# Patient Record
Sex: Male | Born: 1950 | Race: Black or African American | Hispanic: No | Smoking: Never smoker
Health system: Southern US, Community
[De-identification: ages and names within clinical notes are randomized; demographics above are authoritative.]

## PROBLEM LIST (undated history)

## (undated) DIAGNOSIS — G47419 Narcolepsy without cataplexy: Secondary | ICD-10-CM

## (undated) DIAGNOSIS — F419 Anxiety disorder, unspecified: Secondary | ICD-10-CM

## (undated) DIAGNOSIS — C61 Malignant neoplasm of prostate: Secondary | ICD-10-CM

## (undated) DIAGNOSIS — G4730 Sleep apnea, unspecified: Secondary | ICD-10-CM

## (undated) DIAGNOSIS — K219 Gastro-esophageal reflux disease without esophagitis: Secondary | ICD-10-CM

## (undated) DIAGNOSIS — E785 Hyperlipidemia, unspecified: Secondary | ICD-10-CM

## (undated) DIAGNOSIS — F32A Depression, unspecified: Secondary | ICD-10-CM

## (undated) DIAGNOSIS — M1990 Unspecified osteoarthritis, unspecified site: Secondary | ICD-10-CM

## (undated) DIAGNOSIS — F4310 Post-traumatic stress disorder, unspecified: Secondary | ICD-10-CM

## (undated) DIAGNOSIS — I10 Essential (primary) hypertension: Secondary | ICD-10-CM

## (undated) DIAGNOSIS — H409 Unspecified glaucoma: Secondary | ICD-10-CM

## (undated) HISTORY — PX: FOOT SURGERY: SHX648

## (undated) HISTORY — PX: SHOULDER SURGERY: SHX246

## (undated) HISTORY — PX: THROAT SURGERY: SHX803

---

## 2019-10-13 ENCOUNTER — Encounter: Payer: Self-pay | Admitting: Emergency Medicine

## 2019-10-13 ENCOUNTER — Other Ambulatory Visit: Payer: Self-pay

## 2019-10-13 ENCOUNTER — Emergency Department: Payer: Medicare Other

## 2019-10-13 DIAGNOSIS — Z7901 Long term (current) use of anticoagulants: Secondary | ICD-10-CM | POA: Insufficient documentation

## 2019-10-13 DIAGNOSIS — I82432 Acute embolism and thrombosis of left popliteal vein: Secondary | ICD-10-CM | POA: Insufficient documentation

## 2019-10-13 DIAGNOSIS — I82462 Acute embolism and thrombosis of left calf muscular vein: Secondary | ICD-10-CM | POA: Diagnosis not present

## 2019-10-13 DIAGNOSIS — I10 Essential (primary) hypertension: Secondary | ICD-10-CM | POA: Diagnosis not present

## 2019-10-13 DIAGNOSIS — M79605 Pain in left leg: Secondary | ICD-10-CM | POA: Diagnosis present

## 2019-10-13 LAB — CBC WITH DIFFERENTIAL/PLATELET
Abs Immature Granulocytes: 0.01 10*3/uL (ref 0.00–0.07)
Basophils Absolute: 0 10*3/uL (ref 0.0–0.1)
Basophils Relative: 0 %
Eosinophils Absolute: 0.2 10*3/uL (ref 0.0–0.5)
Eosinophils Relative: 4 %
HCT: 40.9 % (ref 39.0–52.0)
Hemoglobin: 13.7 g/dL (ref 13.0–17.0)
Immature Granulocytes: 0 %
Lymphocytes Relative: 25 %
Lymphs Abs: 1.5 10*3/uL (ref 0.7–4.0)
MCH: 30.5 pg (ref 26.0–34.0)
MCHC: 33.5 g/dL (ref 30.0–36.0)
MCV: 91.1 fL (ref 80.0–100.0)
Monocytes Absolute: 0.7 10*3/uL (ref 0.1–1.0)
Monocytes Relative: 11 %
Neutro Abs: 3.7 10*3/uL (ref 1.7–7.7)
Neutrophils Relative %: 60 %
Platelets: 151 10*3/uL (ref 150–400)
RBC: 4.49 MIL/uL (ref 4.22–5.81)
RDW: 12.4 % (ref 11.5–15.5)
WBC: 6 10*3/uL (ref 4.0–10.5)
nRBC: 0 % (ref 0.0–0.2)

## 2019-10-13 LAB — COMPREHENSIVE METABOLIC PANEL
ALT: 16 U/L (ref 0–44)
AST: 19 U/L (ref 15–41)
Albumin: 3.9 g/dL (ref 3.5–5.0)
Alkaline Phosphatase: 61 U/L (ref 38–126)
Anion gap: 9 (ref 5–15)
BUN: 12 mg/dL (ref 8–23)
CO2: 27 mmol/L (ref 22–32)
Calcium: 8.8 mg/dL — ABNORMAL LOW (ref 8.9–10.3)
Chloride: 105 mmol/L (ref 98–111)
Creatinine, Ser: 0.99 mg/dL (ref 0.61–1.24)
GFR calc Af Amer: >60 mL/min (ref >60)
GFR calc non Af Amer: >60 mL/min (ref >60)
Glucose, Bld: 134 mg/dL — ABNORMAL HIGH (ref 70–99)
Potassium: 3.5 mmol/L (ref 3.5–5.1)
Sodium: 141 mmol/L (ref 135–145)
Total Bilirubin: 0.6 mg/dL (ref 0.3–1.2)
Total Protein: 8 g/dL (ref 6.5–8.1)

## 2019-10-13 LAB — BRAIN NATRIURETIC PEPTIDE: B Natriuretic Peptide: 81 pg/mL (ref 0.0–100.0)

## 2019-10-13 NOTE — ED Triage Notes (Signed)
PT arrives with complaints of left lower leg swelling and pain for the last week.

## 2019-10-13 NOTE — ED Notes (Signed)
Patient transported to Ultrasound 

## 2019-10-14 ENCOUNTER — Emergency Department
Admission: EM | Admit: 2019-10-14 | Discharge: 2019-10-14 | Disposition: A | Discharge status: Home / Self Care | Payer: Medicare Other | Attending: Emergency Medicine | Admitting: Emergency Medicine

## 2019-10-14 ENCOUNTER — Emergency Department: Payer: Medicare Other

## 2019-10-14 DIAGNOSIS — I82462 Acute embolism and thrombosis of left calf muscular vein: Secondary | ICD-10-CM | POA: Diagnosis not present

## 2019-10-14 DIAGNOSIS — I82432 Acute embolism and thrombosis of left popliteal vein: Secondary | ICD-10-CM

## 2019-10-14 HISTORY — DX: Unspecified osteoarthritis, unspecified site: M19.90

## 2019-10-14 HISTORY — DX: Essential (primary) hypertension: I10

## 2019-10-14 LAB — PROTIME-INR
INR: 1 (ref 0.8–1.2)
Prothrombin Time: 13.3 seconds (ref 11.4–15.2)

## 2019-10-14 LAB — APTT: aPTT: 40 seconds — ABNORMAL HIGH (ref 24–36)

## 2019-10-14 MED ORDER — GADOBUTROL 1 MMOL/ML IV SOLN
9.0000 mL | Freq: Once | INTRAVENOUS | Status: AC | PRN
  Administered 2019-10-14: 9 mL via INTRAVENOUS

## 2019-10-14 MED ORDER — OXYCODONE-ACETAMINOPHEN 5-325 MG PO TABS: 1.0000 | ORAL_TABLET | ORAL | 0 refills | Status: AC | PRN

## 2019-10-14 MED ORDER — ENOXAPARIN SODIUM 100 MG/ML ~~LOC~~ SOLN
100.0000 mg | Freq: Once | SUBCUTANEOUS | Status: AC
  Administered 2019-10-14: 100 mg via SUBCUTANEOUS
  Filled 2019-10-14: qty 1

## 2019-10-14 MED ORDER — APIXABAN 5 MG PO TABS: ORAL_TABLET | ORAL | 0 refills | Status: DC

## 2019-10-14 NOTE — ED Notes (Signed)
Pt on phone with MRI

## 2019-10-14 NOTE — ED Provider Notes (Signed)
[facility] Emergency Department Provider Note    First MD Initiated Contact with Patient 10/14/19 0147     (approximate)  I have reviewed the triage vital signs and the nursing notes.   HISTORY  Chief Complaint Leg Swelling   HPI Alex Sanders is a 68 y.o. male with below list of previous medical conditions presents emergency department secondary to left lower extremity pain and swelling times x1 week.  Patient states that secondary to bilateral osteoarthritis of the knees he was wearing a knee brace a week ago including sleeping in the brace.  Patient states that he felt as though he may have "bad circulation as a result of the knee brace".  Patient denies any pain at present however does admit to considerable pain with weightbearing.  Patient denies any chest pain or shortness of breath.  No previous history of DVT or PE.       Past Medical History:  Diagnosis Date   Arthritis    Hypertension     There are no active problems to display for this patient.   Past Surgical History:  Procedure Laterality Date   FOOT SURGERY     SHOULDER SURGERY     THROAT SURGERY      Prior to Admission medications   Medication Sig Start Date End Date Taking? Authorizing Provider  apixaban (ELIQUIS) 5 MG TABS tablet Take 2 tablets (10mg ) twice daily for 7 days, then 1 tablet (5mg ) twice daily 10/14/19   Gregor Hams, MD    Allergies Lodine [etodolac]  No family history on file.  Social History Social History   Tobacco Use   Smoking status: Never Smoker   Smokeless tobacco: Never Used  Substance Use Topics   Alcohol use: Not on file   Drug use: Not on file    Review of Systems Constitutional: No fever/chills Eyes: No visual changes. ENT: No sore throat. Cardiovascular: Denies chest pain. Respiratory: Denies shortness of breath. Gastrointestinal: No abdominal pain.  No nausea, no vomiting.  No diarrhea.  No  constipation. Genitourinary: Negative for dysuria. Musculoskeletal: Negative for neck pain.  Negative for back pain.  Left lower extremity pain and swelling Integumentary: Negative for rash. Neurological: Negative for headaches, focal weakness or numbness.  ____________________________________________   PHYSICAL EXAM:  VITAL SIGNS: ED Triage Vitals  Enc Vitals Group     BP 10/13/19 2219 (!) 185/95     Pulse Rate 10/13/19 2219 87     Resp 10/13/19 2219 17     Temp 10/13/19 2219 98.1 F (36.7 C)     Temp Source 10/13/19 2219 Oral     SpO2 10/13/19 2219 98 %     Weight 10/13/19 2218 95.3 kg (210 lb)     Height 10/13/19 2218 1.829 m (6')     Head Circumference --      Peak Flow --      Pain Score 10/13/19 2218 8     Pain Loc --      Pain Edu? --      Excl. in [place]? --     Constitutional: Alert and oriented.  Eyes: Conjunctivae are normal.  Head: Atraumatic. Mouth/Throat: Patient is wearing a mask. Neck: No stridor.  No meningeal signs.   Cardiovascular: Normal rate, regular rhythm. Good peripheral circulation. Grossly normal heart sounds. Respiratory: Normal respiratory effort.  No retractions. Gastrointestinal: Soft and nontender. No distention.  Musculoskeletal: Nonpitting left lower extremity edema extending from the knee to the foot.  Equally  palpable PT DP pulses bilaterally.  Leg warm to touch overlying skin revealed no gross abnormality. Neurologic:  Normal speech and language. No gross focal neurologic deficits are appreciated.  Skin:  Skin is warm, dry and intact. Psychiatric: Mood and affect are normal. Speech and behavior are normal.  ____________________________________________   LABS (all labs ordered are listed, but only abnormal results are displayed)  Labs Reviewed  COMPREHENSIVE METABOLIC PANEL - Abnormal; Notable for the following components:      Result Value   Glucose, Bld 134 (*)    Calcium 8.8 (*)    All other components within normal limits   APTT - Abnormal; Notable for the following components:   aPTT 40 (*)    All other components within normal limits  CBC WITH DIFFERENTIAL/PLATELET  BRAIN NATRIURETIC PEPTIDE  PROTIME-INR     RADIOLOGY I, Lockhart N Kennethia Lynes, personally viewed and evaluated these images (plain radiographs) as part of my medical decision making, as well as reviewing the written report by the radiologist.  ED MD interpretation: Occlusive DVT extending from the popliteal fossa into the calf veins with a heterogeneous collection in the popliteal fossa concern for possible hematoma versus Baker's cyst per radiologist.  Official radiology report(s): Mr Tibia Fibula Left W Wo Contrast  Result Date: 10/14/2019 CLINICAL DATA:  Left lower leg swelling and pain for the past week. Popliteal mass, complex Baker cyst or large hematoma seen on the left on a lower extremity venous Doppler ultrasound examination yesterday. EXAM: MRI OF LOWER LEFT EXTREMITY WITHOUT AND WITH CONTRAST TECHNIQUE: Multiplanar, multisequence MR imaging of the left knee and lower leg was performed both before and after administration of intravenous contrast. CONTRAST:  65mL GADAVIST GADOBUTROL 1 MMOL/ML IV SOLN COMPARISON:  Left lower extremity venous Doppler ultrasound dated 10/13/2019. FINDINGS: Bones/Joint/Cartilage Unremarkable. Ligaments Unremarkable. Muscles and Tendons 17.2 x 7.4 x 4.5 cm elongated, oval, heterogeneous mass in the medial head of the gastrocnemius muscle. No associated enhancement. There is a smaller similar area with different signal characteristics more anteriorly and medially, medial to the proximal tibia. This measures 3.5 x 2.3 x 1.3 cm. Also demonstrated is diffuse subcutaneous edema. There is also diffuse edema in the medial head of gastrocnemius muscle and to a lesser degree, in the lateral head of the gastrocnemius muscle. Soft tissues Diffuse subcutaneous edema. IMPRESSION: 1. 17.2 x 7.4 x 4.5 cm elongated, heterogeneous mass  in the medial head of the gastrocnemius muscle. The appearance is most consistent with a benign intramuscular hematoma. 2. 3.5 x 2.3 x 1.3 cm probable additional hematoma medial to the proximal tibia. 3. Diffuse subcutaneous edema and diffuse edema in the medial head of the gastrocnemius muscle and to a lesser degree, in the lateral head of the gastrocnemius muscle. Electronically Signed   By: Claudie Revering M.D.   On: 10/14/2019 05:28   US Venous Img Lower Unilateral Left  Result Date: 10/13/2019 CLINICAL DATA:  Lower extremity swelling EXAM: LEFT LOWER EXTREMITY VENOUS DOPPLER ULTRASOUND TECHNIQUE: Gray-scale sonography with graded compression, as well as color Doppler and duplex ultrasound were performed to evaluate the lower extremity deep venous systems from the level of the common femoral vein and including the common femoral, femoral, profunda femoral, popliteal and calf veins including the posterior tibial, peroneal and gastrocnemius veins when visible. The superficial great saphenous vein was also interrogated. Spectral Doppler was utilized to evaluate flow at rest and with distal augmentation maneuvers in the common femoral, femoral and popliteal veins. COMPARISON:  None. FINDINGS: Contralateral  Common Femoral Vein: Respiratory phasicity is normal and symmetric with the symptomatic side. No evidence of thrombus. Normal compressibility. Common Femoral Vein: No evidence of thrombus. Normal compressibility, respiratory phasicity and response to augmentation. Saphenofemoral Junction: No evidence of thrombus. Normal compressibility and flow on color Doppler imaging. Profunda Femoral Vein: No evidence of thrombus. Normal compressibility and flow on color Doppler imaging. Femoral Vein: No evidence of thrombus. Normal compressibility, respiratory phasicity and response to augmentation. Popliteal Vein: Occlusive, noncompressible echogenic thrombus occluding the popliteal vein. Calf Veins: Complete occlusive  thrombus throughout the visual posterior tibial peroneal veins. Superficial Great Saphenous Vein: No evidence of thrombus. Normal compressibility. Venous Reflux:  None. Other Findings: There is a large, heterogeneous structure, possibly hyperdense collection extending from the posterolateral knee/popliteal fossa inferiorly to the lateral calf. There is some peripheral color Doppler flow but no definite internal vascularity is seen. IMPRESSION: Occlusive deep venous thrombus extending from popliteal fossa through the calf veins. Nonspecific, heterogeneous structure, suspicious for a collection, extending from the popliteal fossa inferiorly through the midcalf. No convincing internal vascularity but with some peripheral color flow. Finding could reflect a complex Baker's cyst or large hematoma though ultimately remains indeterminate. Correlate for a history of. Could consider advanced cross-sectional imaging with contrast for further evaluation if clinically warranted. These results were called by telephone at the time of interpretation on 10/13/2019 at 11:38 pm to provider Dr Beather Arbour, who verbally acknowledged these results. Electronically Signed   By: Lovena Le M.D.   On: 10/13/2019 23:39     Procedures   ____________________________________________   INITIAL IMPRESSION / MDM / ASSESSMENT AND PLAN / ED COURSE  As part of my medical decision making, I reviewed the following data within the electronic MEDICAL RECORD NUMBER  68 year old male presenting with above-stated history and physical exam concerning for possible DVT which was confirmed on ultrasound here in the emergency department.  Popliteal structure concerning for Baker's cyst versus hematoma warranted further evaluation with an MRI which revealed hematoma in the medial head of the gastrocnemius as well as proximal tibia.  Patient will be prescribed Eliquis twice daily at home with recommendation to follow-up with primary care provider on Monday.   Spoke with the patient at length regarding Eliquis and the risk of bleeding.  In addition advised the patient to return to the emergency department immediately if he experienced chest pain or shortness of breath.       ____________________________________________  FINAL CLINICAL IMPRESSION(S) / ED DIAGNOSES  Final diagnoses:  Acute deep vein thrombosis (DVT) of calf muscle vein of left lower extremity (HCC)  Acute deep vein thrombosis (DVT) of popliteal vein of left lower extremity ([place])     MEDICATIONS GIVEN DURING THIS VISIT:  Medications  enoxaparin (LOVENOX) injection 100 mg (100 mg Subcutaneous Given 10/14/19 0519)  gadobutrol (GADAVIST) 1 MMOL/ML injection 9 mL (9 mLs Intravenous Contrast Given 10/14/19 0502)     ED Discharge Orders         Ordered    apixaban (ELIQUIS) 5 MG TABS tablet     10/14/19 0636          *Please note:  Printis Eckenrod was evaluated in Emergency Department on 10/14/2019 for the symptoms described in the history of present illness. He was evaluated in the context of the global COVID-19 pandemic, which necessitated consideration that the patient might be at risk for infection with the SARS-CoV-2 virus that causes COVID-19. Institutional protocols and algorithms that pertain to the evaluation of patients at risk  for COVID-19 are in a state of rapid change based on information released by regulatory bodies including the CDC and federal and state organizations. These policies and algorithms were followed during the patient's care in the ED.  Some ED evaluations and interventions may be delayed as a result of limited staffing during the pandemic.*  Note:  This document was prepared using Dragon voice recognition software and may include unintentional dictation errors.   Gregor Hams, MD 10/14/19 [phone]

## 2019-10-14 NOTE — ED Notes (Signed)
Emptied 345mL urine

## 2019-10-14 NOTE — ED Provider Notes (Signed)
Notified of MR result addendum. Pt already has PCP appointment scheduled. Will have him follow-up with PCP for monitoring hematoma and need for repeat MRI as indicated.   Duffy Bruce, MD 10/14/19 1209

## 2019-10-14 NOTE — ED Notes (Signed)
Patient still in MRI.  

## 2019-10-14 NOTE — ED Notes (Signed)
Patient provided with urinal to void.

## 2019-10-14 NOTE — ED Notes (Signed)
Patient transported to MRI 

## 2020-07-18 ENCOUNTER — Other Ambulatory Visit
Admission: RE | Admit: 2020-07-18 | Discharge: 2020-07-18 | Disposition: A | Discharge status: Home / Self Care | Payer: Medicare Other | Source: Ambulatory Visit | Attending: Internal Medicine | Admitting: Internal Medicine

## 2020-07-18 ENCOUNTER — Other Ambulatory Visit: Payer: Self-pay

## 2020-07-18 DIAGNOSIS — Z01812 Encounter for preprocedural laboratory examination: Secondary | ICD-10-CM | POA: Diagnosis present

## 2020-07-18 DIAGNOSIS — Z20822 Contact with and (suspected) exposure to covid-19: Secondary | ICD-10-CM | POA: Diagnosis not present

## 2020-07-18 LAB — SARS CORONAVIRUS 2 (TAT 6-24 HRS): SARS Coronavirus 2: NEGATIVE

## 2020-07-19 ENCOUNTER — Encounter: Payer: Self-pay | Admitting: Internal Medicine

## 2020-07-22 ENCOUNTER — Ambulatory Visit: Payer: Medicare Other | Admitting: Certified Registered Nurse Anesthetist

## 2020-07-22 ENCOUNTER — Other Ambulatory Visit: Payer: Self-pay

## 2020-07-22 ENCOUNTER — Encounter: Payer: Self-pay | Admitting: Internal Medicine

## 2020-07-22 ENCOUNTER — Encounter
Admission: RE | Disposition: A | Discharge status: Home / Self Care | Payer: Self-pay | Source: Home / Self Care | Attending: Internal Medicine

## 2020-07-22 ENCOUNTER — Ambulatory Visit
Admission: RE | Admit: 2020-07-22 | Discharge: 2020-07-22 | Disposition: A | Discharge status: Home / Self Care | Payer: Medicare Other | Attending: Internal Medicine | Admitting: Internal Medicine

## 2020-07-22 DIAGNOSIS — Z7982 Long term (current) use of aspirin: Secondary | ICD-10-CM | POA: Insufficient documentation

## 2020-07-22 DIAGNOSIS — K2950 Unspecified chronic gastritis without bleeding: Secondary | ICD-10-CM | POA: Insufficient documentation

## 2020-07-22 DIAGNOSIS — G47419 Narcolepsy without cataplexy: Secondary | ICD-10-CM | POA: Diagnosis not present

## 2020-07-22 DIAGNOSIS — D125 Benign neoplasm of sigmoid colon: Secondary | ICD-10-CM | POA: Insufficient documentation

## 2020-07-22 DIAGNOSIS — I10 Essential (primary) hypertension: Secondary | ICD-10-CM | POA: Diagnosis not present

## 2020-07-22 DIAGNOSIS — Z86010 Personal history of colonic polyps: Secondary | ICD-10-CM | POA: Insufficient documentation

## 2020-07-22 DIAGNOSIS — F4310 Post-traumatic stress disorder, unspecified: Secondary | ICD-10-CM | POA: Insufficient documentation

## 2020-07-22 DIAGNOSIS — K5730 Diverticulosis of large intestine without perforation or abscess without bleeding: Secondary | ICD-10-CM | POA: Insufficient documentation

## 2020-07-22 DIAGNOSIS — Z7901 Long term (current) use of anticoagulants: Secondary | ICD-10-CM | POA: Diagnosis not present

## 2020-07-22 DIAGNOSIS — K219 Gastro-esophageal reflux disease without esophagitis: Secondary | ICD-10-CM | POA: Insufficient documentation

## 2020-07-22 DIAGNOSIS — H409 Unspecified glaucoma: Secondary | ICD-10-CM | POA: Insufficient documentation

## 2020-07-22 DIAGNOSIS — Z79899 Other long term (current) drug therapy: Secondary | ICD-10-CM | POA: Insufficient documentation

## 2020-07-22 DIAGNOSIS — F329 Major depressive disorder, single episode, unspecified: Secondary | ICD-10-CM | POA: Insufficient documentation

## 2020-07-22 DIAGNOSIS — F419 Anxiety disorder, unspecified: Secondary | ICD-10-CM | POA: Diagnosis not present

## 2020-07-22 DIAGNOSIS — D509 Iron deficiency anemia, unspecified: Secondary | ICD-10-CM | POA: Diagnosis present

## 2020-07-22 DIAGNOSIS — K640 First degree hemorrhoids: Secondary | ICD-10-CM | POA: Insufficient documentation

## 2020-07-22 DIAGNOSIS — G4730 Sleep apnea, unspecified: Secondary | ICD-10-CM | POA: Insufficient documentation

## 2020-07-22 DIAGNOSIS — M1990 Unspecified osteoarthritis, unspecified site: Secondary | ICD-10-CM | POA: Insufficient documentation

## 2020-07-22 DIAGNOSIS — E785 Hyperlipidemia, unspecified: Secondary | ICD-10-CM | POA: Diagnosis not present

## 2020-07-22 HISTORY — DX: Unspecified glaucoma: H40.9

## 2020-07-22 HISTORY — DX: Post-traumatic stress disorder, unspecified: F43.10

## 2020-07-22 HISTORY — DX: Depression, unspecified: F32.A

## 2020-07-22 HISTORY — DX: Anxiety disorder, unspecified: F41.9

## 2020-07-22 HISTORY — PX: ESOPHAGOGASTRODUODENOSCOPY (EGD) WITH PROPOFOL: SHX5813

## 2020-07-22 HISTORY — DX: Hyperlipidemia, unspecified: E78.5

## 2020-07-22 HISTORY — DX: Sleep apnea, unspecified: G47.30

## 2020-07-22 HISTORY — PX: COLONOSCOPY WITH PROPOFOL: SHX5780

## 2020-07-22 HISTORY — DX: Gastro-esophageal reflux disease without esophagitis: K21.9

## 2020-07-22 HISTORY — DX: Narcolepsy without cataplexy: G47.419

## 2020-07-22 SURGERY — ESOPHAGOGASTRODUODENOSCOPY (EGD) WITH PROPOFOL
Anesthesia: General

## 2020-07-22 MED ORDER — PROPOFOL 500 MG/50ML IV EMUL
INTRAVENOUS | Status: DC | PRN
  Administered 2020-07-22: 140 ug/kg/min via INTRAVENOUS

## 2020-07-22 MED ORDER — SODIUM CHLORIDE 0.9 % IV SOLN: INTRAVENOUS | Status: DC

## 2020-07-22 MED ORDER — LIDOCAINE HCL (CARDIAC) PF 100 MG/5ML IV SOSY
PREFILLED_SYRINGE | INTRAVENOUS | Status: DC | PRN
  Administered 2020-07-22: 50 mg via INTRAVENOUS

## 2020-07-22 NOTE — Op Note (Signed)
[facility] Gastroenterology Patient Name: Alex Sanders Procedure Date: 07/22/2020 10:33 AM MRN: [id number] Account #: [id number] Date of Birth: 1951/02/02 Admit Type: Outpatient Age: 69 Room: [facility] ENDO ROOM 3 Gender: Male Note Status: Finalized Procedure:             Colonoscopy Indications:           Unexplained iron deficiency anemia Providers:             Benay Pike. [name] MD, MD Referring MD:          [facility] (Referring MD) Medicines:             Propofol per Anesthesia Complications:         No immediate complications. Procedure:             Pre-Anesthesia Assessment:                        - The risks and benefits of the procedure and the                         sedation options and risks were discussed with the                         patient. All questions were answered and informed                         consent was obtained.                        - Patient identification and proposed procedure were                         verified prior to the procedure by the nurse. The                         procedure was verified in the procedure room.                        - ASA Grade Assessment: III - A patient with severe                         systemic disease.                        - After reviewing the risks and benefits, the patient                         was deemed in satisfactory condition to undergo the                         procedure.                        After obtaining informed consent, the colonoscope was                         passed under direct vision. Throughout the procedure,                         the patient's blood pressure, pulse,  and oxygen                         saturations were monitored continuously. The                         Colonoscope was introduced through the anus and                         advanced to the the cecum, identified by appendiceal                         orifice and ileocecal valve.  The colonoscopy was                         performed without difficulty. The patient tolerated                         the procedure well. The quality of the bowel                         preparation was excellent. The ileocecal valve,                         appendiceal orifice, and rectum were photographed. Findings:      The perianal and digital rectal examinations were normal. Pertinent       negatives include normal sphincter tone and no palpable rectal lesions.      A few small-mouthed diverticula were found in the ascending colon.      Three sessile polyps were found in the sigmoid colon. The polyps were       diminutive in size. These polyps were removed with a cold biopsy       forceps. Resection and retrieval were complete.      Non-bleeding internal hemorrhoids were found during retroflexion. The       hemorrhoids were Grade I (internal hemorrhoids that do not prolapse).      The exam was otherwise without abnormality. Impression:            - Diverticulosis in the ascending colon.                        - Three diminutive polyps in the sigmoid colon,                         removed with a cold biopsy forceps. Resected and                         retrieved.                        - Non-bleeding internal hemorrhoids.                        - The examination was otherwise normal. Recommendation:        - Patient has a contact number available for                         emergencies. The signs and symptoms of potential  delayed complications were discussed with the patient.                         Return to normal activities tomorrow. Written                         discharge instructions were provided to the patient.                        - Resume previous diet.                        - Continue present medications.                        - Repeat colonoscopy is recommended for surveillance.                         The colonoscopy date will be determined  after                         pathology results from today's exam become available                         for review.                        - Return to GI office PRN.                        - The findings and recommendations were discussed with                         the patient. Procedure Code(s):     --- Professional ---                        [phone], Colonoscopy, flexible; with biopsy, single or                         multiple Diagnosis Code(s):     --- Professional ---                        K57.30, Diverticulosis of large intestine without                         perforation or abscess without bleeding                        D50.9, Iron deficiency anemia, unspecified                        K63.5, Polyp of colon                        K64.0, First degree hemorrhoids CPT copyright 2019 American Medical Association. All rights reserved. The codes documented in this report are preliminary and upon coder review may  be revised to meet current compliance requirements. Efrain Sella MD, MD 07/22/2020 11:02:28 AM This report has been signed electronically. Number of Addenda: 0 Note Initiated On: 07/22/2020 10:33 AM Scope Withdrawal Time: 0 hours 4  minutes 40 seconds  Total Procedure Duration: 0 hours 10 minutes 41 seconds  Estimated Blood Loss:  Estimated blood loss: none.      [facility]

## 2020-07-22 NOTE — Anesthesia Postprocedure Evaluation (Signed)
Anesthesia Post Note  Patient: Alex Sanders  Procedure(s) Performed: ESOPHAGOGASTRODUODENOSCOPY (EGD) WITH PROPOFOL (N/A ) COLONOSCOPY WITH PROPOFOL (N/A )  Patient location during evaluation: Endoscopy Anesthesia Type: General Level of consciousness: awake and alert and oriented Pain management: pain level controlled Vital Signs Assessment: post-procedure vital signs reviewed and stable Respiratory status: spontaneous breathing Cardiovascular status: blood pressure returned to baseline Anesthetic complications: no   No complications documented.   Last Vitals:  Vitals:   07/22/20 0930 07/22/20 1102  BP: (!) 153/89 (!) 104/52  Pulse: 74 66  Resp: 18 10  Temp: (!) 36.3 C   SpO2: 100% 96%    Last Pain:  Vitals:   07/22/20 1102  TempSrc:   PainSc: Asleep                 Yohannes Waibel

## 2020-07-22 NOTE — Interval H&P Note (Signed)
History and Physical Interval Note:  07/22/2020 10:26 AM  Alex Sanders  has presented today for surgery, with the diagnosis of IDA,PERSONAL HX.OF COLON POLYPS.  The various methods of treatment have been discussed with the patient and family. After consideration of risks, benefits and other options for treatment, the patient has consented to  Procedure(s): ESOPHAGOGASTRODUODENOSCOPY (EGD) WITH PROPOFOL (N/A) COLONOSCOPY WITH PROPOFOL (N/A) as a surgical intervention.  The patient's history has been reviewed, patient examined, no change in status, stable for surgery.  I have reviewed the patient's chart and labs.  Questions were answered to the patient's satisfaction.     [place], [place]

## 2020-07-22 NOTE — H&P (Signed)
Outpatient short stay form Pre-procedure 07/22/2020 10:21 AM Alex Siebert K. [name], M.D.  Primary Physician: [facility]  Reason for visit:  Iron deficiency anemia, personal history of adenomatous colon polyps  History of present illness:  Pleasant 69 y/o male iron deficiency. Patient denies change in bowel habits, rectal bleeding, weight loss or abdominal pain.  Patient denies intractable heartburn, dysphagia, hemetemesis, abdominal pain, nausea or vomiting.     Current Facility-Administered Medications:  .  0.9 %  sodium chloride infusion, , Intravenous, Continuous, [place], Alex Pike, MD, Last Rate: 20 mL/hr at 07/22/20 1012, Continued from Pre-op at 07/22/20 1012  Medications Prior to Admission  Medication Sig Dispense Refill Last Dose  . acetaminophen (TYLENOL) 325 MG tablet Take by mouth as needed.   Past Month at Unknown time  . alfuzosin (UROXATRAL) 10 MG 24 hr tablet Take 10 mg by mouth daily.   07/19/2020  . aspirin EC 81 MG tablet Take 81 mg by mouth daily. Swallow whole.   07/18/2020  . diclofenac Sodium (VOLTAREN) 1 % GEL Apply 2 g topically 4 (four) times daily.   05/22/2020  . Difluprednate (DUREZOL) 0.05 % EMUL Place 1 drop in the right eye 4 times daily and 1 drop into the left eye once daily   07/22/2020 at Unknown time  . diltiazem (TIAZAC) 240 MG 24 hr capsule Take 240 mg by mouth daily.   07/22/2020 at Unknown time  . dorzolamide-timolol (COSOPT) 22.3-6.8 MG/ML ophthalmic solution Place 1 drop into both eyes 2 (two) times daily.   07/22/2020 at Unknown time  . ezetimibe (ZETIA) 10 MG tablet Take 10 mg by mouth daily.   07/18/2020  . finasteride (PROSCAR) 5 MG tablet Take 5 mg by mouth daily.   07/18/2020  . furosemide (LASIX) 40 MG tablet Take 40 mg by mouth.   07/20/2020  . losartan (COZAAR) 50 MG tablet Take 50 mg by mouth daily.   07/22/2020 at Unknown time  . Multiple Vitamins-Minerals (MULTIVITAMIN ADULTS 50+) TABS Take 1 tablet by mouth daily.   07/15/2020  . omeprazole  (PRILOSEC) 20 MG capsule Take 20 mg by mouth daily.   07/18/2020  . PARoxetine (PAXIL) 40 MG tablet Take 40 mg by mouth daily.   07/18/2020  . polyethylene glycol (MIRALAX / GLYCOLAX) 17 g packet Take 17 g by mouth daily as needed. Mix in 4-8 oz. fluid prior to taking     . sildenafil (VIAGRA) 100 MG tablet Take 100 mg by mouth daily as needed for erectile dysfunction.     . simvastatin (ZOCOR) 20 MG tablet Take 20 mg by mouth daily.   07/18/2020  . tolterodine (DETROL LA) 4 MG 24 hr capsule Take 4 mg by mouth daily.   07/18/2020  . traZODone (DESYREL) 100 MG tablet Take 100 mg by mouth at bedtime.   07/18/2020  . aluminum chloride (DRYSOL) 20 % external solution Apply topically daily. (Patient not taking: Reported on 07/22/2020)   Not Taking at Unknown time  . apixaban (ELIQUIS) 5 MG TABS tablet Take 2 tablets (10mg ) twice daily for 7 days, then 1 tablet (5mg ) twice daily 60 tablet 0 03/22/2020  . oxyCODONE-acetaminophen (PERCOCET) 5-325 MG tablet Take 1 tablet by mouth every 4 (four) hours as needed for severe pain. (Patient not taking: Reported on 07/22/2020) 20 tablet 0 Not Taking at Unknown time     Allergies  Allergen Reactions  . Lodine [Etodolac]     Swelling      Past Medical History:  Diagnosis Date  . Anxiety   . Arthritis   . Depression   . GERD (gastroesophageal reflux disease)   . Glaucoma   . Hyperlipidemia   . Hypertension   . Narcolepsy   . PTSD (post-traumatic stress disorder)   . Sleep apnea     Review of systems:  Otherwise negative.    Physical Exam  Gen: Alert, oriented. Appears stated age.  HEENT: [place]/AT. PERRLA. Lungs: CTA, no wheezes. CV: RR nl S1, S2. Abd: soft, benign, no masses. BS+ Ext: No edema. Pulses 2+    Planned procedures: Proceed with EGD and colonoscopy. The patient understands the nature of the planned procedure, indications, risks, alternatives and potential complications including but not limited to bleeding, infection, perforation, damage to  internal organs and possible oversedation/side effects from anesthesia. The patient agrees and gives consent to proceed.  Please refer to procedure notes for findings, recommendations and patient disposition/instructions.     [name] K. [name], M.D. Gastroenterology 07/22/2020  10:21 AM

## 2020-07-22 NOTE — Op Note (Signed)
[facility]enterology Patient Name: Alex Sanders Procedure Date: 07/22/2020 10:34 AM MRN: [id number] Account #: [id number] Date of Birth: 02/12/1951 Admit Type: Outpatient Age: 69 Room: [facility] ENDO ROOM 3 Gender: Male Note Status: Finalized Procedure:             Upper GI endoscopy Indications:           Suspected upper gastrointestinal bleeding in patient                         with unexplained iron deficiency anemia Providers:             Benay Pike. [name] MD, MD Referring MD:          [facility] (Referring MD) Medicines:             Propofol per Anesthesia Complications:         No immediate complications. Procedure:             Pre-Anesthesia Assessment:                        - The risks and benefits of the procedure and the                         sedation options and risks were discussed with the                         patient. All questions were answered and informed                         consent was obtained.                        - Patient identification and proposed procedure were                         verified prior to the procedure by the nurse. The                         procedure was verified in the procedure room.                        - ASA Grade Assessment: III - A patient with severe                         systemic disease.                        - After reviewing the risks and benefits, the patient                         was deemed in satisfactory condition to undergo the                         procedure.                        After obtaining informed consent, the endoscope was                         passed under direct  vision. Throughout the procedure,                         the patient's blood pressure, pulse, and oxygen                         saturations were monitored continuously. The Endoscope                         was introduced through the mouth, and advanced to the                          third part of duodenum. The upper GI endoscopy was                         accomplished without difficulty. The patient tolerated                         the procedure well. Findings:      The examined esophagus was normal.      Patchy mild inflammation characterized by erythema was found in the       gastric antrum. Biopsies were taken with a cold forceps for Helicobacter       pylori testing.      The examined duodenum was normal.      The cardia and gastric fundus were normal on retroflexion. Impression:            - Normal esophagus.                        - Gastritis. Biopsied.                        - Normal examined duodenum. Recommendation:        - Await pathology results.                        - Proceed with colonoscopy Procedure Code(s):     --- Professional ---                        [phone], Esophagogastroduodenoscopy, flexible,                         transoral; with biopsy, single or multiple Diagnosis Code(s):     --- Professional ---                        D50.9, Iron deficiency anemia, unspecified                        K29.70, Gastritis, unspecified, without bleeding CPT copyright 2019 American Medical Association. All rights reserved. The codes documented in this report are preliminary and upon coder review may  be revised to meet current compliance requirements. Efrain Sella MD, MD 07/22/2020 10:45:37 AM This report has been signed electronically. Number of Addenda: 0 Note Initiated On: 07/22/2020 10:34 AM Estimated Blood Loss:  Estimated blood loss: none.      [facility]

## 2020-07-22 NOTE — Interval H&P Note (Signed)
History and Physical Interval Note:  07/22/2020 10:26 AM  Alex Sanders  has presented today for surgery, with the diagnosis of IDA,PERSONAL HX.OF COLON POLYPS.  The various methods of treatment have been discussed with the patient and family. After consideration of risks, benefits and other options for treatment, the patient has consented to  Procedure(s): ESOPHAGOGASTRODUODENOSCOPY (EGD) WITH PROPOFOL (N/A) COLONOSCOPY WITH PROPOFOL (N/A) as a surgical intervention.  The patient's history has been reviewed, patient examined, no change in status, stable for surgery.  I have reviewed the patient's chart and labs.  Questions were answered to the patient's satisfaction.     [place], [place]

## 2020-07-22 NOTE — Anesthesia Preprocedure Evaluation (Signed)
Anesthesia Evaluation  Patient identified by MRN, date of birth, ID band Patient awake    Reviewed: Allergy & Precautions, NPO status , Patient's Chart, lab work & pertinent test results  Airway Mallampati: II       Dental   Pulmonary sleep apnea ,           Cardiovascular hypertension,      Neuro/Psych PSYCHIATRIC DISORDERS Anxiety Depression    GI/Hepatic Neg liver ROS, GERD  ,  Endo/Other  negative endocrine ROS  Renal/GU negative Renal ROS  negative genitourinary   Musculoskeletal  (+) Arthritis , Osteoarthritis,    Abdominal   Peds negative pediatric ROS (+)  Hematology negative hematology ROS (+)   Anesthesia Other Findings Past Medical History: No date: Anxiety No date: Arthritis No date: Depression No date: GERD (gastroesophageal reflux disease) No date: Glaucoma No date: Hyperlipidemia No date: Hypertension No date: Narcolepsy No date: PTSD (post-traumatic stress disorder) No date: Sleep apnea  Reproductive/Obstetrics                             Anesthesia Physical Anesthesia Plan  ASA: III  Anesthesia Plan: General   Post-op Pain Management:    Induction: Intravenous  PONV Risk Score and Plan:   Airway Management Planned: Nasal Cannula  Additional Equipment:   Intra-op Plan:   Post-operative Plan:   Informed Consent: I have reviewed the patients History and Physical, chart, labs and discussed the procedure including the risks, benefits and alternatives for the proposed anesthesia with the patient or authorized representative who has indicated his/her understanding and acceptance.     Dental advisory given  Plan Discussed with: CRNA and Surgeon  Anesthesia Plan Comments:         Anesthesia Quick Evaluation

## 2020-07-22 NOTE — Transfer of Care (Signed)
Immediate Anesthesia Transfer of Care Note  Patient: Alex Sanders  Procedure(s) Performed: ESOPHAGOGASTRODUODENOSCOPY (EGD) WITH PROPOFOL (N/A ) COLONOSCOPY WITH PROPOFOL (N/A )  Patient Location: PACU  Anesthesia Type:General  Level of Consciousness: awake, alert  and oriented  Airway & Oxygen Therapy: Patient Spontanous Breathing and Patient connected to nasal cannula oxygen  Post-op Assessment: Report given to RN and Post -op Vital signs reviewed and stable  Post vital signs: Reviewed and stable  Last Vitals:  Vitals Value Taken Time  BP 104/52 07/22/20 1103  Temp    Pulse 67 07/22/20 1104  Resp 16 07/22/20 1104  SpO2 94 % 07/22/20 1104  Vitals shown include unvalidated device data.  Last Pain:  Vitals:   07/22/20 0930  TempSrc: Temporal  PainSc: 0-No pain         Complications: No complications documented.

## 2020-07-23 ENCOUNTER — Encounter: Payer: Self-pay | Admitting: Internal Medicine

## 2020-07-24 LAB — SURGICAL PATHOLOGY

## 2021-04-01 ENCOUNTER — Other Ambulatory Visit: Payer: Self-pay | Admitting: Radiation Oncology

## 2021-04-01 ENCOUNTER — Ambulatory Visit
Admission: RE | Admit: 2021-04-01 | Discharge: 2021-04-01 | Disposition: A | Discharge status: Home / Self Care | Payer: Self-pay | Source: Ambulatory Visit | Attending: Radiation Oncology | Admitting: Radiation Oncology

## 2021-04-01 DIAGNOSIS — C61 Malignant neoplasm of prostate: Secondary | ICD-10-CM

## 2021-04-07 NOTE — Progress Notes (Signed)
GU Location of Tumor / Histology: prostatic adenocarcinoma  If Prostate Cancer, Gleason Score is (4 + 5) and PSA is (5.38)  Debbe Mounts presented for further evaluation of a rising PSA.   06/09/18 psa 1.32 07/21/19  psa 2.44 09/25/20 psa 4.24 11/27/20 psa 5.38  Biopsies of prostate (if applicable) revealed:   Past/Anticipated interventions by urology, if any: prostate biopsy, MRI (right lateral mid gland nodule), bone scan (NED), referral to Dr. Tammi Klippel to discuss radiation options.  Past/Anticipated interventions by medical oncology, if any: no  Weight changes, if any: no  Bowel/Bladder complaints, if any: IPSS 4. SHIM 16. Denies dysuria or hematuria. Reports rare scant leakage associated with urinary urgency. Denies bowel or bladder incontinence. Denies any bowel complaints.   Nausea/Vomiting, if any: denies  Pain issues, if any:  Reports intermittent arthritic pain. Denies new pain.  SAFETY ISSUES:  Prior radiation? denies  Pacemaker/ICD? denies  Possible current pregnancy? no, male patient  Is the patient on methotrexate? denies  Current Complaints / other details:  70 year old male. Divorced. Patient has one son and one daughter. Retired [employer]. Resides in [place]. Patient has a strong family hx of cancer.

## 2021-04-08 ENCOUNTER — Ambulatory Visit
Admission: RE | Admit: 2021-04-08 | Discharge: 2021-04-08 | Disposition: A | Discharge status: Home / Self Care | Payer: Medicare Other | Source: Ambulatory Visit | Attending: Radiation Oncology | Admitting: Radiation Oncology

## 2021-04-08 ENCOUNTER — Other Ambulatory Visit: Payer: Self-pay

## 2021-04-08 ENCOUNTER — Encounter: Payer: Self-pay | Admitting: Radiation Oncology

## 2021-04-08 VITALS — Ht 72.0 in | Wt 220.0 lb

## 2021-04-08 DIAGNOSIS — C61 Malignant neoplasm of prostate: Secondary | ICD-10-CM

## 2021-04-08 HISTORY — DX: Malignant neoplasm of prostate: C61

## 2021-04-08 NOTE — Progress Notes (Signed)
Radiation Oncology         [phone] ________________________________  Initial Outpatient Consultation - Conducted via Telephone due to current COVID-19 concerns for limiting patient exposure  Name: Alex Sanders MRN: [id number]  Date: 04/08/2021  DOB: 03-07-1951  VO:HKGOV, Melba Coon, MD  Alger Simons, MD   REFERRING PHYSICIAN: Alger Simons, MD  DIAGNOSIS: 70 y.o. gentleman with Stage T1c adenocarcinoma of the prostate with Gleason score of 4+5, and PSA of 5.38.    ICD-10-CM   1. Malignant neoplasm of prostate ([place])  C61     HISTORY OF PRESENT ILLNESS: Alex Sanders is a 70 y.o. male with a diagnosis of prostate cancer. He was noted to have an elevated PSA of of 4.24 on 09/25/20 and further elevated at 5.38 when repeated on 11/27/20 by his primary care physician, Dr. Andree Elk.  Accordingly, he was referred for evaluation in urology by Dr. Raymond Gurney on 12/17/20,  digital rectal examination was performed at that time revealing no concerning findings or nodules. He underwent prostate MRI on 01/01/21 showing a 0.6 cm PI-RADS 4 lesion in the right lateral mid gland peripheral zone as well as additional PI-RADS 3 nodules in the left mid gland. The patient proceeded to MRI fusion biopsy of the prostate on 03/04/21.  The prostate volume measured 20.4 cc.  Out of 22 core biopsies, 13 were positive.  The maximum Gleason score was 4+5, and this was seen in one right mid core, one right base core, two samples from ROI MRI lesion #2 and all three samples from ROI MRI lesion #3. Additionally, Gleason 4+4 was seen in both left mid cores and one left base core, and Gleason 4+3 in one left apex core and two samples from ROI MRI lesion #1.  He underwent staging bone scan on 03/27/21 at [facility] showing no osseous metastatic disease.  He met with Dr. Dalene Seltzer in Radiation Oncology at [facility] on 03/31/21 to discuss treatment options and was in favor of proceeding with ADT concurrent with EBRT but preferred to have this  in [place], closer to his home.  Therefore, the patient has kindly been referred today for discussion of potential radiation treatment options here at our facility.   PREVIOUS RADIATION THERAPY: No  PAST MEDICAL HISTORY:  Past Medical History:  Diagnosis Date  . Anxiety   . Arthritis   . Depression   . GERD (gastroesophageal reflux disease)   . Glaucoma   . Hyperlipidemia   . Hypertension   . Narcolepsy   . Prostate cancer ([place])   . PTSD (post-traumatic stress disorder)   . Sleep apnea       PAST SURGICAL HISTORY: Past Surgical History:  Procedure Laterality Date  . COLONOSCOPY WITH PROPOFOL N/A 07/22/2020   Procedure: COLONOSCOPY WITH PROPOFOL;  Surgeon: Toledo, Benay Pike, MD;  Location: [facility] ENDOSCOPY;  Service: Gastroenterology;  Laterality: N/A;  . ESOPHAGOGASTRODUODENOSCOPY (EGD) WITH PROPOFOL N/A 07/22/2020   Procedure: ESOPHAGOGASTRODUODENOSCOPY (EGD) WITH PROPOFOL;  Surgeon: Toledo, Benay Pike, MD;  Location: [facility] ENDOSCOPY;  Service: Gastroenterology;  Laterality: N/A;  . FOOT SURGERY    . PROSTATE BIOPSY    . SHOULDER SURGERY    . THROAT SURGERY      FAMILY HISTORY:  Family History  Problem Relation Age of Onset  . Colon cancer Mother   . Prostate cancer Father   . Breast cancer Sister   . Cancer Brother        in his eye  . Pancreatic cancer Sister  SOCIAL HISTORY:  Social History   Socioeconomic History  . Marital status: Divorced    Spouse name: Not on file  . Number of children: 2  . Years of education: Not on file  . Highest education level: Not on file  Occupational History  . Not on file  Tobacco Use  . Smoking status: Never Smoker  . Smokeless tobacco: Never Used  Vaping Use  . Vaping Use: Never used  Substance and Sexual Activity  . Alcohol use: Yes    Comment: occassionally   . Drug use: Never  . Sexual activity: Yes  Other Topics Concern  . Not on file  Social History Narrative  . Not on file   Social Determinants of  Health   Financial Resource Strain: Not on file  Food Insecurity: Not on file  Transportation Needs: Not on file  Physical Activity: Not on file  Stress: Not on file  Social Connections: Not on file  Intimate Partner Violence: Not on file    ALLERGIES: Etodolac and Benzalkonium chloride  MEDICATIONS:  Current Outpatient Medications  Medication Sig Dispense Refill  . acetaminophen (TYLENOL) 325 MG tablet Take by mouth as needed.    Marland Kitchen alfuzosin (UROXATRAL) 10 MG 24 hr tablet Take 10 mg by mouth daily.    Marland Kitchen aspirin EC 81 MG tablet Take 81 mg by mouth daily. Swallow whole.    . diclofenac Sodium (VOLTAREN) 1 % GEL Apply 2 g topically 4 (four) times daily.    . Difluprednate (DUREZOL) 0.05 % EMUL Place 1 drop in the right eye 4 times daily and 1 drop into the left eye once daily    . diltiazem (TIAZAC) 240 MG 24 hr capsule Take 240 mg by mouth daily.    . dorzolamide-timolol (COSOPT) 22.3-6.8 MG/ML ophthalmic solution Place 1 drop into both eyes 2 (two) times daily.    Marland Kitchen ezetimibe (ZETIA) 10 MG tablet Take 10 mg by mouth daily.    . finasteride (PROSCAR) 5 MG tablet Take 5 mg by mouth daily.    . furosemide (LASIX) 40 MG tablet Take 40 mg by mouth.    . losartan (COZAAR) 50 MG tablet Take 50 mg by mouth daily.    . Multiple Vitamins-Minerals (MULTIVITAMIN ADULTS 50+) TABS Take 1 tablet by mouth daily.    Marland Kitchen omeprazole (PRILOSEC) 20 MG capsule Take 20 mg by mouth daily.    Marland Kitchen PARoxetine (PAXIL) 40 MG tablet Take 40 mg by mouth daily.    . polyethylene glycol (MIRALAX / GLYCOLAX) 17 g packet Take 17 g by mouth daily as needed. Mix in 4-8 oz. fluid prior to taking    . sildenafil (VIAGRA) 100 MG tablet Take 100 mg by mouth daily as needed for erectile dysfunction.    . simvastatin (ZOCOR) 20 MG tablet Take 20 mg by mouth daily.    Marland Kitchen tolterodine (DETROL LA) 4 MG 24 hr capsule Take 4 mg by mouth daily.    . traZODone (DESYREL) 100 MG tablet Take 100 mg by mouth at bedtime.     No current  facility-administered medications for this encounter.    REVIEW OF SYSTEMS:  On review of systems, the patient reports that he is doing well overall. He denies any chest pain, shortness of breath, cough, fevers, chills, night sweats, unintended weight changes. He denies any bowel disturbances, and denies abdominal pain, nausea or vomiting. He denies any new musculoskeletal or joint aches or pains. His IPSS was 4, indicating mild urinary symptoms with  nocturia x2 and urgency with rare scant leakage if he postpone voiding for too long. His SHIM was 16, indicating he has moderate erectile dysfunction. A complete review of systems is obtained and is otherwise negative.  PHYSICAL EXAM:  Wt Readings from Last 3 Encounters:  04/08/21 220 lb (99.8 kg)  07/22/20 215 lb (97.5 kg)  10/13/19 210 lb (95.3 kg)   Temp Readings from Last 3 Encounters:  07/22/20 (!) 97.4 F (36.3 C) (Temporal)  10/14/19 98 F (36.7 C) (Oral)   BP Readings from Last 3 Encounters:  07/22/20 (!) 153/93  10/14/19 (!) 147/72   Pulse Readings from Last 3 Encounters:  07/22/20 (!) 55  10/14/19 78   Pain Assessment Pain Score: 0-No pain (reports intermittent arthritic pain. denies new pains.)/10  Physical exam not performed in light of telephone consult visit format.   KPS = 100  100 - Normal; no complaints; no evidence of disease. 90   - Able to carry on normal activity; minor signs or symptoms of disease. 80   - Normal activity with effort; some signs or symptoms of disease. 76   - Cares for self; unable to carry on normal activity or to do active work. 60   - Requires occasional assistance, but is able to care for most of his personal needs. 50   - Requires considerable assistance and frequent medical care. 40   - Disabled; requires special care and assistance. 70   - Severely disabled; hospital admission is indicated although death not imminent. 41   - Very sick; hospital admission necessary; active supportive  treatment necessary. 10   - Moribund; fatal processes progressing rapidly. 0     - Dead  Karnofsky DA, Abelmann [place], Craver LS and Burchenal [facility] [phone]) The use of the nitrogen mustards in the palliative treatment of carcinoma: with particular reference to bronchogenic carcinoma Cancer 1 [phone]  LABORATORY DATA:  Lab Results  Component Value Date   WBC 6.0 10/13/2019   HGB 13.7 10/13/2019   HCT 40.9 10/13/2019   MCV 91.1 10/13/2019   PLT 151 10/13/2019   Lab Results  Component Value Date   NA 141 10/13/2019   K 3.5 10/13/2019   CL 105 10/13/2019   CO2 27 10/13/2019   Lab Results  Component Value Date   ALT 16 10/13/2019   AST 19 10/13/2019   ALKPHOS 61 10/13/2019   BILITOT 0.6 10/13/2019     RADIOGRAPHY: No results found.    IMPRESSION/PLAN: This visit was conducted via Telephone to spare the patient unnecessary potential exposure in the healthcare setting during the current COVID-19 pandemic. 1. 70 y.o. gentleman with Stage T1c adenocarcinoma of the prostate with Gleason Score of 4+5, and PSA of 5.38. We discussed the patient's workup and outlined the nature of prostate cancer in this setting. The patient's T stage, Gleason's score, and PSA put him into the high risk group. Accordingly, he is eligible for a variety of potential treatment options including prostatectomy, or LT-ADT in combination with either 8 weeks of external radiation, or 5 weeks of external radiation preceded by a brachytherapy boost. We discussed the available radiation techniques, and focused on the details and logistics of delivery. We discussed and outlined the risks, benefits, short and long-term effects associated with radiotherapy and compared and contrasted these with prostatectomy.  We also detailed the role of ADT in the treatment of high risk prostate cancer and outlined the associated side effects that could be expected with this therapy.  We  explained the rationale behind the intentional delay of  starting radiotherapy for approximately 2 months after the start of ADT to allow for the radiosensitizing effects of this therapy.  He was encouraged to ask questions that were answered to his stated satisfaction.  At the end of the conversation, the patient is interested in moving forward with 8 weeks of external beam therapy in combination with LT-ADT. He has not received his first Lupron injection. We will share our discussion with Dr. Raymond Gurney and make arrangements for start of ADT, first available. We will also help coordinate for fiducial marker placement in late June 2022, prior to simulation, to reduce rectal toxicity from radiotherapy. The patient appears to have a good understanding of his disease and our treatment recommendations which are of curative intent and is in agreement with the stated plan.  Therefore, we will move forward with treatment planning accordingly, in anticipation of beginning IMRT approximately 2 months after starting ADT.  Given current concerns for patient exposure during the COVID-19 pandemic, this encounter was conducted via telephone. The patient was notified in advance and was offered a MyChart meeting to allow for face to face communication but unfortunately reported that he did not have the appropriate resources/technology to support such a visit and instead preferred to proceed with telephone consult. The patient has given verbal consent for this type of encounter. The time spent during this encounter was 30 minutes. The attendants for this meeting include Tyler Pita MD, Ashlyn Bruning PA-C, [place], and patient, Alex Sanders. During the encounter, Tyler Pita MD, Ashlyn Bruning PA-C, and scribe, Wilburn Mylar were located at [facility].  Patient, Alex Sanders was located at home.    Alex Johns, PA-C    Tyler Pita, MD  [facility] Direct Dial:  [phone]  Fax: [phone] [email or web address]  Skype  [email or web address]   This document serves as a record of services personally performed by Tyler Pita, MD and Freeman Caldron, PA-C. It was created on their behalf by Wilburn Mylar, a trained medical scribe. The creation of this record is based on the scribe's personal observations and the provider's statements to them. This document has been checked and approved by the attending provider.

## 2021-04-22 ENCOUNTER — Encounter: Payer: Self-pay | Admitting: Medical Oncology

## 2021-04-25 ENCOUNTER — Telehealth: Payer: Self-pay | Admitting: Radiation Oncology

## 2021-04-25 NOTE — Telephone Encounter (Signed)
Received message from patient requesting a return call. Phoned patient to inquire. Patient explains he hasn't heard from his urologist about getting his initial ADT injection. Patient verbalizes no having heard from them makes him anxious. Attempted to reassure patient by explaining that Cira Rue, our prostate navigator, will phone Dr. Glean Salvo office then phone him with an appointment by end of day Monday. Patient verbalized understanding and appreciation for the assistance.

## 2021-05-06 ENCOUNTER — Telehealth: Payer: Self-pay | Admitting: *Deleted

## 2021-05-06 ENCOUNTER — Encounter: Payer: Self-pay | Admitting: Medical Oncology

## 2021-05-06 NOTE — Telephone Encounter (Signed)
CALLED PATIENT TO INFORM THAT I HAVE CALLED [facility] AND [facility] FOR A NURSE AND SHE HAS NOT CALLED ME BACK YET, I TOLD HIM THAT I WOULD CALL HIM TOMORROW AND HOPEFULLY WE CAN GET THIS ARRANGED, PATIENT VERIFIED UNDERSTANDING THIS

## 2021-05-07 ENCOUNTER — Telehealth: Payer: Self-pay | Admitting: Radiation Oncology

## 2021-05-07 ENCOUNTER — Telehealth: Payer: Self-pay | Admitting: *Deleted

## 2021-05-07 NOTE — Telephone Encounter (Signed)
Called patient to make him aware that Cira Rue, prostate navigator would call him tomorrow, patient verified understanding this

## 2021-05-07 NOTE — Telephone Encounter (Signed)
CALLED PATIENT TO UPDATE, INFORMED PATIENT THAT I AM STILL WAITING FOR THE NURSE FROM [facility] TO GIVE ME A CALL, PATIENT VERIFIED UNDERSTANDING THIS

## 2021-05-07 NOTE — Telephone Encounter (Signed)
Awaiting return call from Vear Clock, prostate navigator at [facility]. Received voicemail message from Gene Autry, triage nurse at [facility]. Jenny Reichmann explains that Remo Lipps is out until 05/13/21. Jenny Reichmann verbalized her understanding that Mr. Friberg should receive his ADT at [facility] "since he is receiving his treatment there. Jenny Reichmann provided her cell phone number of [phone] then explained she will be out of the office the rest of the week. Patient diagnosed in October 2021. This RN will inform the patient's [facility] providers and nurse navigator of these findings so arrangements can be made for patient to receive ADT at [facility].

## 2021-05-08 ENCOUNTER — Encounter: Payer: Self-pay | Admitting: Medical Oncology

## 2021-05-08 ENCOUNTER — Other Ambulatory Visit: Payer: Self-pay

## 2021-05-08 ENCOUNTER — Encounter: Payer: Self-pay | Admitting: Radiation Oncology

## 2021-05-08 ENCOUNTER — Inpatient Hospital Stay: Payer: Medicare Other | Attending: Radiation Oncology

## 2021-05-08 VITALS — BP 146/69 | HR 70

## 2021-05-08 DIAGNOSIS — C61 Malignant neoplasm of prostate: Secondary | ICD-10-CM | POA: Insufficient documentation

## 2021-05-08 MED ORDER — LEUPROLIDE ACETATE (6 MONTH) 45 MG ~~LOC~~ KIT
45.0000 mg | PACK | Freq: Once | SUBCUTANEOUS | Status: AC
  Administered 2021-05-08: 45 mg via SUBCUTANEOUS
  Filled 2021-05-08: qty 45

## 2021-05-08 NOTE — Progress Notes (Signed)
Patient scheduled for hormone injection today at 2 pm. I called him to confirm appointment.

## 2021-05-08 NOTE — Progress Notes (Signed)
Called patient to let him know I have him scheduled to see Dr. Alinda Money 6/14 @ 9:30, arriving @ 9:15am. I asked him to call me with questions or concerns. He voiced understanding. Records faxed.

## 2021-05-08 NOTE — Patient Instructions (Signed)

## 2021-05-08 NOTE — Progress Notes (Signed)
Spoke with patient to inform him, [facility] is not willing to give the hormone injection unless he comes there for all of his care. Dr. Tammi Klippel has offered to order the hormone injection so he can start treatment without further delay. He asked how long will it take if he returns to [facility]. I told him I did not know but we can possibly give the injection today. He would like to come here to get the injection.I will contact scheduling to see if we can get him scheduled today and call him back. He voiced understanding. I will discuss a referral to [facility] when he comes in to get fiducial markers placed and follow up post radiation.

## 2021-05-08 NOTE — Progress Notes (Signed)
Patient here to receive Eligard. We discussed getting him an appointment with [facility] to get gold markers/SpaceOar gel and continue urology care post radiation. He is in agreement. We discussed the role of androgen deprivation and side effects. All questions were answered and I asked him to call me with questions or concerns.

## 2021-05-08 NOTE — Progress Notes (Signed)
Pt came to [facility] and states he is upset because he has not been contacted about appointment for ADT.I explained to the patient that Enid Derry has been leaving messages since 4/26 with Dr. Glean Salvo office to get this scheduled. He spoke with Enid Derry and she called back to [facility] while he was still in the office. Messages have been left to the nurse navigator and office staff to return call to discuss ADT. We will call patient as soon as we hear from [facility].  I told patient if we do not have an appointment soon, Dr. Tammi Klippel could possibly order ADT and it be given here, in order to get treatment started. He voiced understanding and his appreciation for our efforts.

## 2021-06-25 ENCOUNTER — Telehealth: Payer: Self-pay | Admitting: *Deleted

## 2021-06-25 NOTE — Telephone Encounter (Signed)
Called patient to inform of fid. markers and space oar placement on 07/10/21 @ [facility] and his sim on 07-18-21- arrival time- 1:15 pm @ [place], spoke with patient and he is aware of this appt.

## 2021-07-17 ENCOUNTER — Telehealth: Payer: Self-pay | Admitting: *Deleted

## 2021-07-17 NOTE — Telephone Encounter (Signed)
CALLED PATIENT TO REMIND OF SIM APPT. FOR 07-18-21- ARRIVAL TIME- 1:15 PM @ [facility], LVM FOR A RETURN CALL

## 2021-07-18 ENCOUNTER — Ambulatory Visit
Admission: RE | Admit: 2021-07-18 | Discharge: 2021-07-18 | Disposition: A | Discharge status: Home / Self Care | Payer: Medicare Other | Source: Ambulatory Visit | Attending: Radiation Oncology | Admitting: Radiation Oncology

## 2021-07-18 ENCOUNTER — Other Ambulatory Visit: Payer: Self-pay

## 2021-07-18 DIAGNOSIS — C61 Malignant neoplasm of prostate: Secondary | ICD-10-CM | POA: Insufficient documentation

## 2021-07-18 DIAGNOSIS — Z510 Encounter for antineoplastic radiation therapy: Secondary | ICD-10-CM | POA: Diagnosis present

## 2021-07-18 NOTE — Progress Notes (Signed)
  Radiation Oncology         [phone]) [phone] ________________________________  Name: Alex Sanders MRN: [id number]  Date: 07/18/2021  DOB: 1951/12/12  SIMULATION AND TREATMENT PLANNING NOTE    ICD-10-CM   1. Malignant neoplasm of prostate ([place])  C61       DIAGNOSIS:  70 y.o. gentleman with Stage T1c adenocarcinoma of the prostate with Gleason score of 4+5, and PSA of 5.38.  NARRATIVE:  The patient was brought to the [facility].  Identity was confirmed.  All relevant records and images related to the planned course of therapy were reviewed.  The patient freely provided informed written consent to proceed with treatment after reviewing the details related to the planned course of therapy. The consent form was witnessed and verified by the simulation staff.  Then, the patient was set-up in a stable reproducible supine position for radiation therapy.  A vacuum lock pillow device was custom fabricated to position his legs in a reproducible immobilized position.  Then, I performed a urethrogram under sterile conditions to identify the prostatic bed.  CT images were obtained.  Surface markings were placed.  The CT images were loaded into the planning software.  Then the prostate bed target, pelvic lymph node target and avoidance structures including the rectum, bladder, bowel and hips were contoured.  Treatment planning then occurred.  The radiation prescription was entered and confirmed.  A total of one complex treatment devices were fabricated. I have requested : Intensity Modulated Radiotherapy (IMRT) is medically necessary for this case for the following reason:  Rectal sparing.Marland Kitchen  PLAN:  The patient will receive 45 Gy in 25 fractions of 1.8 Gy, followed by a boost to the prostate to a total dose of 75 Gy with 15 additional fractions of 2 Gy.   ________________________________  Sheral Apley Tammi Klippel, M.D.

## 2021-07-28 DIAGNOSIS — Z510 Encounter for antineoplastic radiation therapy: Secondary | ICD-10-CM | POA: Diagnosis not present

## 2021-07-29 ENCOUNTER — Other Ambulatory Visit: Payer: Self-pay

## 2021-07-29 ENCOUNTER — Ambulatory Visit
Admission: RE | Admit: 2021-07-29 | Discharge: 2021-07-29 | Disposition: A | Discharge status: Home / Self Care | Payer: Medicare Other | Source: Ambulatory Visit | Attending: Radiation Oncology | Admitting: Radiation Oncology

## 2021-07-29 DIAGNOSIS — Z510 Encounter for antineoplastic radiation therapy: Secondary | ICD-10-CM | POA: Diagnosis not present

## 2021-07-30 ENCOUNTER — Ambulatory Visit
Admission: RE | Admit: 2021-07-30 | Discharge: 2021-07-30 | Disposition: A | Discharge status: Home / Self Care | Payer: Medicare Other | Source: Ambulatory Visit | Attending: Radiation Oncology | Admitting: Radiation Oncology

## 2021-07-30 DIAGNOSIS — Z510 Encounter for antineoplastic radiation therapy: Secondary | ICD-10-CM | POA: Diagnosis not present

## 2021-07-31 ENCOUNTER — Other Ambulatory Visit: Payer: Self-pay

## 2021-07-31 ENCOUNTER — Ambulatory Visit
Admission: RE | Admit: 2021-07-31 | Discharge: 2021-07-31 | Disposition: A | Discharge status: Home / Self Care | Payer: Medicare Other | Source: Ambulatory Visit | Attending: Radiation Oncology | Admitting: Radiation Oncology

## 2021-07-31 DIAGNOSIS — Z510 Encounter for antineoplastic radiation therapy: Secondary | ICD-10-CM | POA: Diagnosis not present

## 2021-08-01 ENCOUNTER — Ambulatory Visit
Admission: RE | Admit: 2021-08-01 | Discharge: 2021-08-01 | Disposition: A | Discharge status: Home / Self Care | Payer: Medicare Other | Source: Ambulatory Visit | Attending: Radiation Oncology | Admitting: Radiation Oncology

## 2021-08-01 ENCOUNTER — Other Ambulatory Visit: Payer: Self-pay

## 2021-08-01 DIAGNOSIS — Z510 Encounter for antineoplastic radiation therapy: Secondary | ICD-10-CM | POA: Diagnosis not present

## 2021-08-04 ENCOUNTER — Ambulatory Visit
Admission: RE | Admit: 2021-08-04 | Discharge: 2021-08-04 | Disposition: A | Discharge status: Home / Self Care | Payer: Medicare Other | Source: Ambulatory Visit | Attending: Radiation Oncology | Admitting: Radiation Oncology

## 2021-08-04 DIAGNOSIS — Z510 Encounter for antineoplastic radiation therapy: Secondary | ICD-10-CM | POA: Diagnosis not present

## 2021-08-05 ENCOUNTER — Ambulatory Visit
Admission: RE | Admit: 2021-08-05 | Discharge: 2021-08-05 | Disposition: A | Discharge status: Home / Self Care | Payer: Medicare Other | Source: Ambulatory Visit | Attending: Radiation Oncology | Admitting: Radiation Oncology

## 2021-08-05 ENCOUNTER — Other Ambulatory Visit: Payer: Self-pay

## 2021-08-05 DIAGNOSIS — Z510 Encounter for antineoplastic radiation therapy: Secondary | ICD-10-CM | POA: Diagnosis not present

## 2021-08-06 ENCOUNTER — Ambulatory Visit
Admission: RE | Admit: 2021-08-06 | Discharge: 2021-08-06 | Disposition: A | Discharge status: Home / Self Care | Payer: Medicare Other | Source: Ambulatory Visit | Attending: Radiation Oncology | Admitting: Radiation Oncology

## 2021-08-06 DIAGNOSIS — Z510 Encounter for antineoplastic radiation therapy: Secondary | ICD-10-CM | POA: Diagnosis not present

## 2021-08-07 ENCOUNTER — Ambulatory Visit
Admission: RE | Admit: 2021-08-07 | Discharge: 2021-08-07 | Disposition: A | Discharge status: Home / Self Care | Payer: Medicare Other | Source: Ambulatory Visit | Attending: Radiation Oncology | Admitting: Radiation Oncology

## 2021-08-07 DIAGNOSIS — Z510 Encounter for antineoplastic radiation therapy: Secondary | ICD-10-CM | POA: Diagnosis not present

## 2021-08-08 ENCOUNTER — Other Ambulatory Visit: Payer: Self-pay

## 2021-08-08 ENCOUNTER — Ambulatory Visit
Admission: RE | Admit: 2021-08-08 | Discharge: 2021-08-08 | Disposition: A | Discharge status: Home / Self Care | Payer: Medicare Other | Source: Ambulatory Visit | Attending: Radiation Oncology | Admitting: Radiation Oncology

## 2021-08-08 DIAGNOSIS — Z510 Encounter for antineoplastic radiation therapy: Secondary | ICD-10-CM | POA: Diagnosis not present

## 2021-08-11 ENCOUNTER — Ambulatory Visit
Admission: RE | Admit: 2021-08-11 | Discharge: 2021-08-11 | Disposition: A | Discharge status: Home / Self Care | Payer: Medicare Other | Source: Ambulatory Visit | Attending: Radiation Oncology | Admitting: Radiation Oncology

## 2021-08-11 ENCOUNTER — Other Ambulatory Visit: Payer: Self-pay

## 2021-08-11 DIAGNOSIS — Z510 Encounter for antineoplastic radiation therapy: Secondary | ICD-10-CM | POA: Diagnosis not present

## 2021-08-12 ENCOUNTER — Ambulatory Visit
Admission: RE | Admit: 2021-08-12 | Discharge: 2021-08-12 | Disposition: A | Discharge status: Home / Self Care | Payer: Medicare Other | Source: Ambulatory Visit | Attending: Radiation Oncology | Admitting: Radiation Oncology

## 2021-08-12 DIAGNOSIS — Z510 Encounter for antineoplastic radiation therapy: Secondary | ICD-10-CM | POA: Diagnosis not present

## 2021-08-13 ENCOUNTER — Other Ambulatory Visit: Payer: Self-pay

## 2021-08-13 ENCOUNTER — Ambulatory Visit
Admission: RE | Admit: 2021-08-13 | Discharge: 2021-08-13 | Disposition: A | Discharge status: Home / Self Care | Payer: Medicare Other | Source: Ambulatory Visit | Attending: Radiation Oncology | Admitting: Radiation Oncology

## 2021-08-13 DIAGNOSIS — Z510 Encounter for antineoplastic radiation therapy: Secondary | ICD-10-CM | POA: Diagnosis not present

## 2021-08-14 ENCOUNTER — Ambulatory Visit
Admission: RE | Admit: 2021-08-14 | Discharge: 2021-08-14 | Disposition: A | Discharge status: Home / Self Care | Payer: Medicare Other | Source: Ambulatory Visit | Attending: Radiation Oncology | Admitting: Radiation Oncology

## 2021-08-14 DIAGNOSIS — C61 Malignant neoplasm of prostate: Secondary | ICD-10-CM | POA: Insufficient documentation

## 2021-08-14 DIAGNOSIS — Z510 Encounter for antineoplastic radiation therapy: Secondary | ICD-10-CM | POA: Diagnosis present

## 2021-08-15 ENCOUNTER — Other Ambulatory Visit: Payer: Self-pay

## 2021-08-15 ENCOUNTER — Ambulatory Visit
Admission: RE | Admit: 2021-08-15 | Discharge: 2021-08-15 | Disposition: A | Discharge status: Home / Self Care | Payer: Medicare Other | Source: Ambulatory Visit | Attending: Radiation Oncology | Admitting: Radiation Oncology

## 2021-08-15 DIAGNOSIS — Z510 Encounter for antineoplastic radiation therapy: Secondary | ICD-10-CM | POA: Diagnosis not present

## 2021-08-19 ENCOUNTER — Ambulatory Visit
Admission: RE | Admit: 2021-08-19 | Discharge: 2021-08-19 | Disposition: A | Discharge status: Home / Self Care | Payer: Medicare Other | Source: Ambulatory Visit | Attending: Radiation Oncology | Admitting: Radiation Oncology

## 2021-08-19 ENCOUNTER — Other Ambulatory Visit: Payer: Self-pay

## 2021-08-19 DIAGNOSIS — Z510 Encounter for antineoplastic radiation therapy: Secondary | ICD-10-CM | POA: Diagnosis not present

## 2021-08-20 ENCOUNTER — Ambulatory Visit
Admission: RE | Admit: 2021-08-20 | Discharge: 2021-08-20 | Disposition: A | Discharge status: Home / Self Care | Payer: Medicare Other | Source: Ambulatory Visit | Attending: Radiation Oncology | Admitting: Radiation Oncology

## 2021-08-20 DIAGNOSIS — Z510 Encounter for antineoplastic radiation therapy: Secondary | ICD-10-CM | POA: Diagnosis not present

## 2021-08-21 ENCOUNTER — Ambulatory Visit
Admission: RE | Admit: 2021-08-21 | Discharge: 2021-08-21 | Disposition: A | Discharge status: Home / Self Care | Payer: Medicare Other | Source: Ambulatory Visit | Attending: Radiation Oncology | Admitting: Radiation Oncology

## 2021-08-21 ENCOUNTER — Other Ambulatory Visit: Payer: Self-pay

## 2021-08-21 DIAGNOSIS — Z510 Encounter for antineoplastic radiation therapy: Secondary | ICD-10-CM | POA: Diagnosis not present

## 2021-08-22 ENCOUNTER — Other Ambulatory Visit: Payer: Self-pay

## 2021-08-22 ENCOUNTER — Ambulatory Visit
Admission: RE | Admit: 2021-08-22 | Discharge: 2021-08-22 | Disposition: A | Discharge status: Home / Self Care | Payer: Medicare Other | Source: Ambulatory Visit | Attending: Radiation Oncology | Admitting: Radiation Oncology

## 2021-08-22 DIAGNOSIS — Z510 Encounter for antineoplastic radiation therapy: Secondary | ICD-10-CM | POA: Diagnosis not present

## 2021-08-25 ENCOUNTER — Ambulatory Visit
Admission: RE | Admit: 2021-08-25 | Discharge: 2021-08-25 | Disposition: A | Discharge status: Home / Self Care | Payer: Medicare Other | Source: Ambulatory Visit | Attending: Radiation Oncology | Admitting: Radiation Oncology

## 2021-08-25 ENCOUNTER — Other Ambulatory Visit: Payer: Self-pay

## 2021-08-25 DIAGNOSIS — Z510 Encounter for antineoplastic radiation therapy: Secondary | ICD-10-CM | POA: Diagnosis not present

## 2021-08-26 ENCOUNTER — Ambulatory Visit
Admission: RE | Admit: 2021-08-26 | Discharge: 2021-08-26 | Disposition: A | Discharge status: Home / Self Care | Payer: Medicare Other | Source: Ambulatory Visit | Attending: Radiation Oncology | Admitting: Radiation Oncology

## 2021-08-26 DIAGNOSIS — Z510 Encounter for antineoplastic radiation therapy: Secondary | ICD-10-CM | POA: Diagnosis not present

## 2021-08-27 ENCOUNTER — Other Ambulatory Visit: Payer: Self-pay

## 2021-08-27 ENCOUNTER — Ambulatory Visit
Admission: RE | Admit: 2021-08-27 | Discharge: 2021-08-27 | Disposition: A | Discharge status: Home / Self Care | Payer: Medicare Other | Source: Ambulatory Visit | Attending: Radiation Oncology | Admitting: Radiation Oncology

## 2021-08-27 DIAGNOSIS — Z510 Encounter for antineoplastic radiation therapy: Secondary | ICD-10-CM | POA: Diagnosis not present

## 2021-08-28 ENCOUNTER — Ambulatory Visit
Admission: RE | Admit: 2021-08-28 | Discharge: 2021-08-28 | Disposition: A | Discharge status: Home / Self Care | Payer: Medicare Other | Source: Ambulatory Visit | Attending: Radiation Oncology | Admitting: Radiation Oncology

## 2021-08-28 DIAGNOSIS — Z510 Encounter for antineoplastic radiation therapy: Secondary | ICD-10-CM | POA: Diagnosis not present

## 2021-08-29 ENCOUNTER — Ambulatory Visit
Admission: RE | Admit: 2021-08-29 | Discharge: 2021-08-29 | Disposition: A | Discharge status: Home / Self Care | Payer: Medicare Other | Source: Ambulatory Visit | Attending: Radiation Oncology | Admitting: Radiation Oncology

## 2021-08-29 ENCOUNTER — Other Ambulatory Visit: Payer: Self-pay

## 2021-08-29 DIAGNOSIS — Z510 Encounter for antineoplastic radiation therapy: Secondary | ICD-10-CM | POA: Diagnosis not present

## 2021-09-01 ENCOUNTER — Ambulatory Visit
Admission: RE | Admit: 2021-09-01 | Discharge: 2021-09-01 | Disposition: A | Discharge status: Home / Self Care | Payer: Medicare Other | Source: Ambulatory Visit | Attending: Radiation Oncology | Admitting: Radiation Oncology

## 2021-09-01 ENCOUNTER — Other Ambulatory Visit: Payer: Self-pay

## 2021-09-01 DIAGNOSIS — Z510 Encounter for antineoplastic radiation therapy: Secondary | ICD-10-CM | POA: Diagnosis not present

## 2021-09-02 ENCOUNTER — Ambulatory Visit
Admission: RE | Admit: 2021-09-02 | Discharge: 2021-09-02 | Disposition: A | Discharge status: Home / Self Care | Payer: Medicare Other | Source: Ambulatory Visit | Attending: Radiation Oncology | Admitting: Radiation Oncology

## 2021-09-02 DIAGNOSIS — Z510 Encounter for antineoplastic radiation therapy: Secondary | ICD-10-CM | POA: Diagnosis not present

## 2021-09-03 ENCOUNTER — Other Ambulatory Visit: Payer: Self-pay

## 2021-09-03 ENCOUNTER — Ambulatory Visit
Admission: RE | Admit: 2021-09-03 | Discharge: 2021-09-03 | Disposition: A | Discharge status: Home / Self Care | Payer: Medicare Other | Source: Ambulatory Visit | Attending: Radiation Oncology | Admitting: Radiation Oncology

## 2021-09-03 DIAGNOSIS — Z510 Encounter for antineoplastic radiation therapy: Secondary | ICD-10-CM | POA: Diagnosis not present

## 2021-09-04 ENCOUNTER — Ambulatory Visit
Admission: RE | Admit: 2021-09-04 | Discharge: 2021-09-04 | Disposition: A | Discharge status: Home / Self Care | Payer: Medicare Other | Source: Ambulatory Visit | Attending: Radiation Oncology | Admitting: Radiation Oncology

## 2021-09-04 DIAGNOSIS — Z510 Encounter for antineoplastic radiation therapy: Secondary | ICD-10-CM | POA: Diagnosis not present

## 2021-09-05 ENCOUNTER — Other Ambulatory Visit: Payer: Self-pay

## 2021-09-05 ENCOUNTER — Ambulatory Visit
Admission: RE | Admit: 2021-09-05 | Discharge: 2021-09-05 | Disposition: A | Discharge status: Home / Self Care | Payer: Medicare Other | Source: Ambulatory Visit | Attending: Radiation Oncology | Admitting: Radiation Oncology

## 2021-09-05 DIAGNOSIS — Z510 Encounter for antineoplastic radiation therapy: Secondary | ICD-10-CM | POA: Diagnosis not present

## 2021-09-08 ENCOUNTER — Ambulatory Visit
Admission: RE | Admit: 2021-09-08 | Discharge: 2021-09-08 | Disposition: A | Discharge status: Home / Self Care | Payer: Medicare Other | Source: Ambulatory Visit | Attending: Radiation Oncology | Admitting: Radiation Oncology

## 2021-09-08 ENCOUNTER — Other Ambulatory Visit: Payer: Self-pay

## 2021-09-08 DIAGNOSIS — Z510 Encounter for antineoplastic radiation therapy: Secondary | ICD-10-CM | POA: Diagnosis not present

## 2021-09-09 ENCOUNTER — Other Ambulatory Visit: Payer: Self-pay

## 2021-09-09 ENCOUNTER — Ambulatory Visit
Admission: RE | Admit: 2021-09-09 | Discharge: 2021-09-09 | Disposition: A | Discharge status: Home / Self Care | Payer: Medicare Other | Source: Ambulatory Visit | Attending: Radiation Oncology | Admitting: Radiation Oncology

## 2021-09-09 DIAGNOSIS — Z510 Encounter for antineoplastic radiation therapy: Secondary | ICD-10-CM | POA: Diagnosis not present

## 2021-09-10 ENCOUNTER — Ambulatory Visit
Admission: RE | Admit: 2021-09-10 | Discharge: 2021-09-10 | Disposition: A | Discharge status: Home / Self Care | Payer: Medicare Other | Source: Ambulatory Visit | Attending: Radiation Oncology | Admitting: Radiation Oncology

## 2021-09-10 DIAGNOSIS — Z510 Encounter for antineoplastic radiation therapy: Secondary | ICD-10-CM | POA: Diagnosis not present

## 2021-09-11 ENCOUNTER — Other Ambulatory Visit: Payer: Self-pay

## 2021-09-11 ENCOUNTER — Ambulatory Visit
Admission: RE | Admit: 2021-09-11 | Discharge: 2021-09-11 | Disposition: A | Discharge status: Home / Self Care | Payer: Medicare Other | Source: Ambulatory Visit | Attending: Radiation Oncology | Admitting: Radiation Oncology

## 2021-09-11 DIAGNOSIS — Z510 Encounter for antineoplastic radiation therapy: Secondary | ICD-10-CM | POA: Diagnosis not present

## 2021-09-12 ENCOUNTER — Ambulatory Visit
Admission: RE | Admit: 2021-09-12 | Discharge: 2021-09-12 | Disposition: A | Discharge status: Home / Self Care | Payer: Medicare Other | Source: Ambulatory Visit | Attending: Radiation Oncology | Admitting: Radiation Oncology

## 2021-09-12 DIAGNOSIS — Z510 Encounter for antineoplastic radiation therapy: Secondary | ICD-10-CM | POA: Diagnosis not present

## 2021-09-15 ENCOUNTER — Ambulatory Visit
Admission: RE | Admit: 2021-09-15 | Discharge: 2021-09-15 | Disposition: A | Discharge status: Home / Self Care | Payer: Medicare Other | Source: Ambulatory Visit | Attending: Radiation Oncology | Admitting: Radiation Oncology

## 2021-09-15 ENCOUNTER — Other Ambulatory Visit: Payer: Self-pay

## 2021-09-15 DIAGNOSIS — C61 Malignant neoplasm of prostate: Secondary | ICD-10-CM | POA: Insufficient documentation

## 2021-09-15 DIAGNOSIS — Z510 Encounter for antineoplastic radiation therapy: Secondary | ICD-10-CM | POA: Insufficient documentation

## 2021-09-16 ENCOUNTER — Ambulatory Visit
Admission: RE | Admit: 2021-09-16 | Discharge: 2021-09-16 | Disposition: A | Discharge status: Home / Self Care | Payer: Medicare Other | Source: Ambulatory Visit | Attending: Radiation Oncology | Admitting: Radiation Oncology

## 2021-09-16 DIAGNOSIS — Z510 Encounter for antineoplastic radiation therapy: Secondary | ICD-10-CM | POA: Diagnosis not present

## 2021-09-17 ENCOUNTER — Ambulatory Visit
Admission: RE | Admit: 2021-09-17 | Discharge: 2021-09-17 | Disposition: A | Discharge status: Home / Self Care | Payer: Medicare Other | Source: Ambulatory Visit | Attending: Radiation Oncology | Admitting: Radiation Oncology

## 2021-09-17 ENCOUNTER — Other Ambulatory Visit: Payer: Self-pay

## 2021-09-17 DIAGNOSIS — Z510 Encounter for antineoplastic radiation therapy: Secondary | ICD-10-CM | POA: Diagnosis not present

## 2021-09-18 ENCOUNTER — Ambulatory Visit
Admission: RE | Admit: 2021-09-18 | Discharge: 2021-09-18 | Disposition: A | Discharge status: Home / Self Care | Payer: Medicare Other | Source: Ambulatory Visit | Attending: Radiation Oncology | Admitting: Radiation Oncology

## 2021-09-18 DIAGNOSIS — Z510 Encounter for antineoplastic radiation therapy: Secondary | ICD-10-CM | POA: Diagnosis not present

## 2021-09-19 ENCOUNTER — Ambulatory Visit
Admission: RE | Admit: 2021-09-19 | Discharge: 2021-09-19 | Disposition: A | Discharge status: Home / Self Care | Payer: Medicare Other | Source: Ambulatory Visit | Attending: Radiation Oncology | Admitting: Radiation Oncology

## 2021-09-19 ENCOUNTER — Other Ambulatory Visit: Payer: Self-pay

## 2021-09-19 DIAGNOSIS — Z510 Encounter for antineoplastic radiation therapy: Secondary | ICD-10-CM | POA: Diagnosis not present

## 2021-09-22 ENCOUNTER — Other Ambulatory Visit: Payer: Self-pay

## 2021-09-22 ENCOUNTER — Ambulatory Visit
Admission: RE | Admit: 2021-09-22 | Discharge: 2021-09-22 | Disposition: A | Discharge status: Home / Self Care | Payer: Medicare Other | Source: Ambulatory Visit | Attending: Radiation Oncology | Admitting: Radiation Oncology

## 2021-09-22 DIAGNOSIS — Z510 Encounter for antineoplastic radiation therapy: Secondary | ICD-10-CM | POA: Diagnosis not present

## 2021-09-23 ENCOUNTER — Ambulatory Visit
Admission: RE | Admit: 2021-09-23 | Discharge: 2021-09-23 | Disposition: A | Discharge status: Home / Self Care | Payer: Medicare Other | Source: Ambulatory Visit | Attending: Radiation Oncology | Admitting: Radiation Oncology

## 2021-09-23 ENCOUNTER — Encounter: Payer: Self-pay | Admitting: Urology

## 2021-09-23 DIAGNOSIS — C61 Malignant neoplasm of prostate: Secondary | ICD-10-CM

## 2021-09-23 DIAGNOSIS — Z510 Encounter for antineoplastic radiation therapy: Secondary | ICD-10-CM | POA: Diagnosis not present

## 2021-10-29 ENCOUNTER — Encounter: Payer: Self-pay | Admitting: General Practice

## 2021-10-29 ENCOUNTER — Ambulatory Visit
Admission: RE | Admit: 2021-10-29 | Discharge: 2021-10-29 | Disposition: A | Discharge status: Home / Self Care | Payer: Medicare Other | Source: Ambulatory Visit | Attending: Urology | Admitting: Urology

## 2021-10-29 ENCOUNTER — Telehealth: Payer: Self-pay

## 2021-10-29 ENCOUNTER — Encounter: Payer: Self-pay | Admitting: Radiation Oncology

## 2021-10-29 ENCOUNTER — Encounter: Payer: Self-pay | Admitting: Urology

## 2021-10-29 DIAGNOSIS — C61 Malignant neoplasm of prostate: Secondary | ICD-10-CM

## 2021-10-29 NOTE — Progress Notes (Signed)
Patient reports arthritis pain 4/10 but otherwise doing well. No issues resorted at this time.  Meaningful use complete. I-PSS Score of 3 (mild).  No current urinary management medications and urology follow-up scheduled for 11/05/21 -per patient.  Patient notified of 10:30-11/16/22 telephone appointment and verbalized understanding.  Patient contact [phone]

## 2021-10-29 NOTE — Progress Notes (Signed)
[place] Psychosocial Distress Screening Clinical Social Work  Clinical Social Work was referred by distress screening protocol.  The patient scored a 5 on the Psychosocial Distress Thermometer which indicates moderate distress. Clinical Social Worker contacted patient by phone to assess for distress and other psychosocial needs. "Doing pretty good, I feel better."  He is grateful that treatment is over and he can get on with life.  Advised him that [place] is available to him should he need [place] in the future.  ONCBCN DISTRESS SCREENING 10/29/2021  Screening Type   Distress experienced in past week (1-10) 5  Emotional problem type Adjusting to illness;Nervousness/Anxiety    Clinical Social Worker follow up needed: No.  If yes, follow up plan:  Beverely Pace, [place], LCSW Clinical Social Worker Phone:  [phone]

## 2021-10-29 NOTE — Telephone Encounter (Signed)
Left message in reference to patient's 10:30am-10/29/21 telephone appointment w/ regard to completing the nursing portion of the appointment. Will attempt to call patient again at scheduled time of 10:30am.

## 2021-10-29 NOTE — Progress Notes (Signed)
Radiation Oncology         [phone]) [phone] ________________________________  Name: Awab Abebe MRN: [id number]  Date: 10/29/2021  DOB: 1951/11/30  Post Treatment Note  CC: Elaina Pattee, MD  [facility]*  Diagnosis:   70 y.o. gentleman with Stage T1c adenocarcinoma of the prostate with Gleason score of 4+5, and PSA of 5.38.     Interval Since Last Radiation:  5 weeks  07/29/21 - 09/23/21; concurrent with ADT (started 05/08/21): 1. The prostate, seminal vesicles, and pelvic lymph nodes were initially treated to 45 Gy in 25 fractions of 1.8 Gy  2. The prostate only was boosted to 75 Gy with 15 additional fractions of 2.0 Gy   Narrative:  I spoke with the patient to conduct his routine scheduled 1 month follow up visit via telephone to spare the patient unnecessary potential exposure in the healthcare setting during the current COVID-19 pandemic.  The patient was notified in advance and gave permission to proceed with this visit format.  He tolerated radiation treatment relatively well with only minor urinary irritation and modest fatigue.  He reported nocturia 3-4x/ight, urgency and occasional diarrhea.                              On review of systems, the patient states that he is doing well in general.  He reports that his LUTS have continued to gradually improve and feels that he is pretty much back to his baseline at this point.  He has continued taking the Uroxatrol daily as prescribed and specifically denies dysuria, gross hematuria, straining to void, incomplete bladder emptying or incontinence.  He continues with nocturia x3 per night but reports that this is only mildly increased from his baseline.  He reports a healthy appetite and is maintaining his weight.  He denies abdominal pain, nausea, vomiting, diarrhea or constipation.  He continues to tolerate the ADT fairly well despite fatigue and occasional hot flashes.  Overall, he is quite pleased with his progress to  date.  ALLERGIES:  is allergic to etodolac and benzalkonium chloride.  Meds: Current Outpatient Medications  Medication Sig Dispense Refill   acetaminophen (TYLENOL) 325 MG tablet Take by mouth as needed.     alfuzosin (UROXATRAL) 10 MG 24 hr tablet Take 10 mg by mouth daily.     aspirin EC 81 MG tablet Take 81 mg by mouth daily. Swallow whole.     diclofenac Sodium (VOLTAREN) 1 % GEL Apply 2 g topically 4 (four) times daily.     Difluprednate (DUREZOL) 0.05 % EMUL Place 1 drop in the right eye 4 times daily and 1 drop into the left eye once daily     diltiazem (TIAZAC) 240 MG 24 hr capsule Take 240 mg by mouth daily.     dorzolamide-timolol (COSOPT) 22.3-6.8 MG/ML ophthalmic solution Place 1 drop into both eyes 2 (two) times daily.     ezetimibe (ZETIA) 10 MG tablet Take 10 mg by mouth daily.     finasteride (PROSCAR) 5 MG tablet Take 5 mg by mouth daily.     furosemide (LASIX) 40 MG tablet Take 40 mg by mouth.     losartan (COZAAR) 50 MG tablet Take 50 mg by mouth daily.     Multiple Vitamins-Minerals (MULTIVITAMIN ADULTS 50+) TABS Take 1 tablet by mouth daily.     omeprazole (PRILOSEC) 20 MG capsule Take 20 mg by mouth daily.     PARoxetine (  PAXIL) 40 MG tablet Take 40 mg by mouth daily.     polyethylene glycol (MIRALAX / GLYCOLAX) 17 g packet Take 17 g by mouth daily as needed. Mix in 4-8 oz. fluid prior to taking     sildenafil (VIAGRA) 100 MG tablet Take 100 mg by mouth daily as needed for erectile dysfunction.     simvastatin (ZOCOR) 20 MG tablet Take 20 mg by mouth daily.     tolterodine (DETROL LA) 4 MG 24 hr capsule Take 4 mg by mouth daily.     traZODone (DESYREL) 100 MG tablet Take 100 mg by mouth at bedtime.     No current facility-administered medications for this encounter.    Physical Findings:  vitals were not taken for this visit.  Pain Assessment Pain Score: 4  (arthritis)/10 Unable to assess due to telephone follow-up visit format.  Lab Findings: Lab Results   Component Value Date   WBC 6.0 10/13/2019   HGB 13.7 10/13/2019   HCT 40.9 10/13/2019   MCV 91.1 10/13/2019   PLT 151 10/13/2019     Radiographic Findings: No results found.  Impression/Plan: 1. 70 y.o. gentleman with Stage T1c adenocarcinoma of the prostate with Gleason score of 4+5, and PSA of 5.38.    He will continue to follow up with urology for ongoing PSA determinations and has an appointment scheduled for labs on 11/05/2021 and then will see Dr. Alinda Money the following week. He understands what to expect with regards to PSA monitoring going forward. I will look forward to following his response to treatment via correspondence with urology, and would be happy to continue to participate in his care if clinically indicated. I talked to the patient about what to expect in the future, including his risk for erectile dysfunction and rectal bleeding. I encouraged him to call or return to the office if he has any questions regarding his previous radiation or possible radiation side effects. He was comfortable with this plan and will follow up as needed.       Nicholos Johns, PA-C

## 2021-10-29 NOTE — Progress Notes (Signed)
  Radiation Oncology         [phone]) [phone] ________________________________  Name: Alex Sanders MRN: [id number]  Date: 09/23/2021  DOB: [birth year]  End of Treatment Note  Diagnosis:   70 y.o. gentleman with Stage T1c adenocarcinoma of the prostate with Gleason score of 4+5, and PSA of 5.38.     Indication for treatment:  Curative, Definitive Radiotherapy       Radiation treatment dates:   07/29/21 - 09/23/21; concurrent with ADT (started 05/08/21)  Site/dose:  1. The prostate, seminal vesicles, and pelvic lymph nodes were initially treated to 45 Gy in 25 fractions of 1.8 Gy  2. The prostate only was boosted to 75 Gy with 15 additional fractions of 2.0 Gy   Beams/energy:  1. The prostate, seminal vesicles, and pelvic lymph nodes were initially treated using VMAT intensity modulated radiotherapy delivering 6 megavolt photons. Image guidance was performed with CB-CT studies prior to each fraction. He was immobilized with a body fix lower extremity mold.  2. the prostate only was boosted using VMAT intensity modulated radiotherapy delivering 6 megavolt photons. Image guidance was performed with CB-CT studies prior to each fraction. He was immobilized with a body fix lower extremity mold.  Narrative: The patient tolerated radiation treatment relatively well with only minor urinary irritation and modest fatigue.  He reported nocturia 3-4x/ight, urgency and occasional diarrhea.  Plan: The patient has completed radiation treatment. He will return to radiation oncology clinic for routine followup in one month. I advised him to call or return sooner if he has any questions or concerns related to his recovery or treatment. ________________________________  Sheral Apley. Tammi Klippel, M.D.

## 2022-04-24 ENCOUNTER — Other Ambulatory Visit: Payer: Self-pay | Admitting: Gastroenterology

## 2022-04-24 DIAGNOSIS — D509 Iron deficiency anemia, unspecified: Secondary | ICD-10-CM

## 2022-04-24 DIAGNOSIS — D61818 Other pancytopenia: Secondary | ICD-10-CM

## 2022-04-24 DIAGNOSIS — C61 Malignant neoplasm of prostate: Secondary | ICD-10-CM

## 2022-05-04 ENCOUNTER — Ambulatory Visit
Admission: RE | Admit: 2022-05-04 | Discharge: 2022-05-04 | Disposition: A | Discharge status: Home / Self Care | Payer: Medicare Other | Source: Ambulatory Visit | Attending: Gastroenterology | Admitting: Gastroenterology

## 2022-05-04 DIAGNOSIS — D509 Iron deficiency anemia, unspecified: Secondary | ICD-10-CM | POA: Insufficient documentation

## 2022-05-04 DIAGNOSIS — D61818 Other pancytopenia: Secondary | ICD-10-CM | POA: Insufficient documentation

## 2022-05-04 DIAGNOSIS — C61 Malignant neoplasm of prostate: Secondary | ICD-10-CM | POA: Diagnosis present

## 2022-05-04 LAB — POCT I-STAT CREATININE: Creatinine, Ser: 0.9 mg/dL (ref 0.61–1.24)

## 2022-05-04 MED ORDER — IOHEXOL 300 MG/ML  SOLN
100.0000 mL | Freq: Once | INTRAMUSCULAR | Status: AC | PRN
  Administered 2022-05-04: 100 mL via INTRAVENOUS

## 2023-06-04 ENCOUNTER — Emergency Department
Admission: EM | Admit: 2023-06-04 | Discharge: 2023-06-04 | Disposition: A | Discharge status: Home / Self Care | Payer: Medicare Other | Attending: Student in an Organized Health Care Education/Training Program | Admitting: Student in an Organized Health Care Education/Training Program

## 2023-06-04 ENCOUNTER — Other Ambulatory Visit: Payer: Self-pay

## 2023-06-04 DIAGNOSIS — L03012 Cellulitis of left finger: Secondary | ICD-10-CM | POA: Diagnosis not present

## 2023-06-04 MED ORDER — DOXYCYCLINE MONOHYDRATE 100 MG PO TABS: 100.0000 mg | ORAL_TABLET | Freq: Two times a day (BID) | ORAL | 0 refills | Status: AC

## 2023-06-04 NOTE — ED Notes (Signed)
Patient is sitting in the lobby. No acute distress.

## 2023-06-04 NOTE — Discharge Instructions (Signed)
Soak finger in warm soapy water 3 times a day. You have been prescribed doxycycline and you can take medication twice daily. Gently push cuticle away from fingernail as explained in this emergency department encounter.

## 2023-06-04 NOTE — ED Provider Notes (Signed)
[facility] Provider Note  Patient Contact: 3:53 PM (approximate)   History   Nail Problem   HPI  Alex Sanders is a 72 y.o. male with a history of frequent paronychias, presents to the emergency department with a paronychia of the left middle finger.  Patient has some mild surrounding cellulitis but no drainable fluid collection.  No significant pain over the pad of the finger no prior history of felon.  No fever or chills.      Physical Exam   Triage Vital Signs: ED Triage Vitals  Enc Vitals Group     BP 06/04/23 1315 137/82     Pulse Rate 06/04/23 1315 76     Resp 06/04/23 1315 18     Temp 06/04/23 1315 98.5 F (36.9 C)     Temp Source 06/04/23 1315 Oral     SpO2 06/04/23 1315 100 %     Weight --      Height --      Head Circumference --      Peak Flow --      Pain Score 06/04/23 1315 9     Pain Loc --      Pain Edu? --      Excl. in GC? --     Most recent vital signs: Vitals:   06/04/23 1315  BP: 137/82  Pulse: 76  Resp: 18  Temp: 98.5 F (36.9 C)  SpO2: 100%     General: Alert and in no acute distress. Eyes:  PERRL. EOMI. Head: No acute traumatic findings ENT:      Nose: No congestion/rhinnorhea.      Mouth/Throat: Mucous membranes are moist. Neck: No stridor. No cervical spine tenderness to palpation. Cardiovascular:  Good peripheral perfusion Respiratory: Normal respiratory effort without tachypnea or retractions. Lungs CTAB. Good air entry to the bases with no decreased or absent breath sounds. Gastrointestinal: Bowel sounds 4 quadrants. Soft and nontender to palpation. No guarding or rigidity. No palpable masses. No distention. No CVA tenderness. Musculoskeletal: Full range of motion to all extremities.  Neurologic:  No gross focal neurologic deficits are appreciated.  Skin: Patient has mild paronychia of left middle finger with no drainable fluid collection.  He does have surrounding cellulitis of approximately 0.5  cm.    ED Results / Procedures / Treatments   Labs (all labs ordered are listed, but only abnormal results are displayed) Labs Reviewed - No data to display     PROCEDURES:  Critical Care performed: No  Procedures   MEDICATIONS ORDERED IN ED: Medications - No data to display   IMPRESSION / MDM / ASSESSMENT AND PLAN / ED COURSE  I reviewed the triage vital signs and the nursing notes.                              Assessment and plan Paronychia 72 year old male presents to the emergency department with a left middle finger paronychia.  Recommended warm soapy soaks 3 times daily and gently pushing cuticle away from fingernail.  I advised patient that antibiotics are not routinely needed for paronychias but patient is concerned about surrounding cellulitis I will cover him with doxycycline twice daily for the next 7 days.  Return precautions were given to return with new or worsening symptoms.  All patient questions were answered.     FINAL CLINICAL IMPRESSION(S) / ED DIAGNOSES   Final diagnoses:  Paronychia of finger of left hand  Rx / DC Orders   ED Discharge Orders          Ordered    doxycycline (ADOXA) 100 MG tablet  2 times daily        06/04/23 1548             Note:  This document was prepared using Dragon voice recognition software and may include unintentional dictation errors.   Pia Mau [place], PA-C 06/04/23 1556    Willy Eddy, MD 06/04/23 [phone]

## 2023-06-04 NOTE — ED Triage Notes (Addendum)
Right third fingernail is swollen and painful since yesterday- pt states nail is ingrown since [employer] service and flairs up occasionally. Distal phalanx of 3rd finger is significantly swollen, CMS intact

## 2023-07-07 ENCOUNTER — Emergency Department: Payer: Medicare Other

## 2023-07-07 ENCOUNTER — Other Ambulatory Visit: Payer: Self-pay

## 2023-07-07 ENCOUNTER — Emergency Department
Admission: EM | Admit: 2023-07-07 | Discharge: 2023-07-07 | Disposition: A | Discharge status: Home / Self Care | Payer: Medicare Other | Attending: Emergency Medicine | Admitting: Emergency Medicine

## 2023-07-07 DIAGNOSIS — R0789 Other chest pain: Secondary | ICD-10-CM | POA: Insufficient documentation

## 2023-07-07 DIAGNOSIS — R079 Chest pain, unspecified: Secondary | ICD-10-CM

## 2023-07-07 DIAGNOSIS — I10 Essential (primary) hypertension: Secondary | ICD-10-CM | POA: Insufficient documentation

## 2023-07-07 DIAGNOSIS — R200 Anesthesia of skin: Secondary | ICD-10-CM | POA: Insufficient documentation

## 2023-07-07 LAB — CBC
HCT: 37.5 % — ABNORMAL LOW (ref 39.0–52.0)
Hemoglobin: 12.9 g/dL — ABNORMAL LOW (ref 13.0–17.0)
MCH: 31.7 pg (ref 26.0–34.0)
MCHC: 34.4 g/dL (ref 30.0–36.0)
MCV: 92.1 fL (ref 80.0–100.0)
Platelets: 138 10*3/uL — ABNORMAL LOW (ref 150–400)
RBC: 4.07 MIL/uL — ABNORMAL LOW (ref 4.22–5.81)
RDW: 12.3 % (ref 11.5–15.5)
WBC: 2.9 10*3/uL — ABNORMAL LOW (ref 4.0–10.5)
nRBC: 0 % (ref 0.0–0.2)

## 2023-07-07 LAB — BASIC METABOLIC PANEL
Anion gap: 6 (ref 5–15)
BUN: 11 mg/dL (ref 8–23)
CO2: 25 mmol/L (ref 22–32)
Calcium: 8.6 mg/dL — ABNORMAL LOW (ref 8.9–10.3)
Chloride: 107 mmol/L (ref 98–111)
Creatinine, Ser: 0.76 mg/dL (ref 0.61–1.24)
GFR, Estimated: >60 mL/min (ref >60)
Glucose, Bld: 126 mg/dL — ABNORMAL HIGH (ref 70–99)
Potassium: 3.7 mmol/L (ref 3.5–5.1)
Sodium: 138 mmol/L (ref 135–145)

## 2023-07-07 LAB — TROPONIN I (HIGH SENSITIVITY): Troponin I (High Sensitivity): 8 ng/L (ref <18)

## 2023-07-07 NOTE — ED Provider Notes (Signed)
[facility] Provider Note    Event Date/Time   First MD Initiated Contact with Patient 07/07/23 1414     (approximate)  History   Chief Complaint: Chest Pain  HPI  Alex Sanders is a 72 y.o. male with a past medical history of anxiety, gastric reflux, hypertension, hyperlipidemia, PTSD, presents to the emergency department for intermittent chest discomfort.  According to the patient over the last 3 weeks or so he has been experiencing some intermittent discomfort where he describes more as a numbness sensation to the center of his chest.  Patient states that comes and goes.  Denies any shortness of breath nausea or diaphoresis.  Patient does state a history of reflux but he wanted to be sure that it was not his heart because he has issues.  Patient follows up with cardiology at [facility].  Denies any prior heart attacks.  Physical Exam   Triage Vital Signs: ED Triage Vitals  Encounter Vitals Group     BP 07/07/23 1258 (!) 141/79     Systolic BP Percentile --      Diastolic BP Percentile --      Pulse Rate 07/07/23 1258 71     Resp 07/07/23 1258 18     Temp 07/07/23 1258 98.6 F (37 C)     Temp Source 07/07/23 1258 Oral     SpO2 07/07/23 1258 96 %     Weight 07/07/23 1259 205 lb (93 kg)     Height 07/07/23 1259 6' (1.829 m)     Head Circumference --      Peak Flow --      Pain Score 07/07/23 1258 2     Pain Loc --      Pain Education --      Exclude from Growth Chart --     Most recent vital signs: Vitals:   07/07/23 1258  BP: (!) 141/79  Pulse: 71  Resp: 18  Temp: 98.6 F (37 C)  SpO2: 96%    General: Awake, no distress.  CV:  Good peripheral perfusion.  Regular rate and rhythm  Resp:  Normal effort.  Equal breath sounds bilaterally.  Abd:  No distention.  Soft, nontender.  No rebound or guarding. Other:  Minimal lower extremity edema no tenderness.  Equal bilaterally.  ED Results / Procedures / Treatments   EKG  EKG viewed and  interpreted by myself shows a normal sinus rhythm at 71 bpm with a narrow QRS, left axis deviation, largely normal intervals with nonspecific but no concerning ST changes.  RADIOLOGY  I have reviewed and interpreted chest x-ray images.  No consolidation seen on my evaluation. Radiology confirms negative chest x-ray.  MEDICATIONS ORDERED IN ED: Medications - No data to display   IMPRESSION / MDM / ASSESSMENT AND PLAN / ED COURSE  I reviewed the triage vital signs and the nursing notes.  Patient's presentation is most consistent with acute presentation with potential threat to life or bodily function.  Patient presents to the emergency department for intermittent symptoms which he describes more as a intermittent numbness to the center of his chest over the past 3 weeks.  No association with exertion.  Patient states it does feel somewhat like reflux but he wanted to be safe and make sure was not his heart.  Patient's workup is reassuring including his chemistry, reassuring CBC troponin is reassuringly negative.  Chest x-ray is clear and EKG shows no concerning findings.  Given the patient's reassuring workup  I believe the patient will be safe for discharge home to follow-up with his outpatient cardiologist.  I did discuss with the patient a trial of liquid Maalox to see if this helps his symptoms.  Discussed my typical chest pain return precautions.  Patient agreeable to plan of care.  FINAL CLINICAL IMPRESSION(S) / ED DIAGNOSES   Chest pain    Note:  This document was prepared using Dragon voice recognition software and may include unintentional dictation errors.   Minna Antis, MD 07/07/23 1434

## 2023-07-07 NOTE — Discharge Instructions (Signed)
Please follow-up with your cardiologist for recheck/reevaluation.  Return to the emergency department for any return of/worsening chest pain or any trouble breathing.  Please use over-the-counter liquid Maalox twice a day for the next several days to see if this helps with your symptoms.

## 2023-07-07 NOTE — ED Triage Notes (Signed)
Pt sts that he has been having chest pain for the last few weeks.

## 2024-03-20 IMAGING — CT CT CHEST-ABD-PELV W/ CM
2 of 5 series · 13 of 36 positions shown, 15 images · IV contrast (agent unspecified)
Comparison: MRI pelvis January 01, 2021 and nuclear medicine bone
scan March 27, 2021.

CLINICAL DATA: Iron-deficiency anemia, history of prostate cancer.
* Tracking Code: BO *

EXAM:
CT CHEST, ABDOMEN, AND PELVIS WITH CONTRAST
TECHNIQUE: Multidetector CT imaging of the chest, abdomen and pelvis was
performed following the standard protocol during bolus
administration of intravenous contrast.

[Series 2: axials cap 5.00 · axial · 0.74mm/px · z∈[-1547,-1012]mm · 10 of 131 slices shown, 12 images]
[im 12/131  mediastinal]
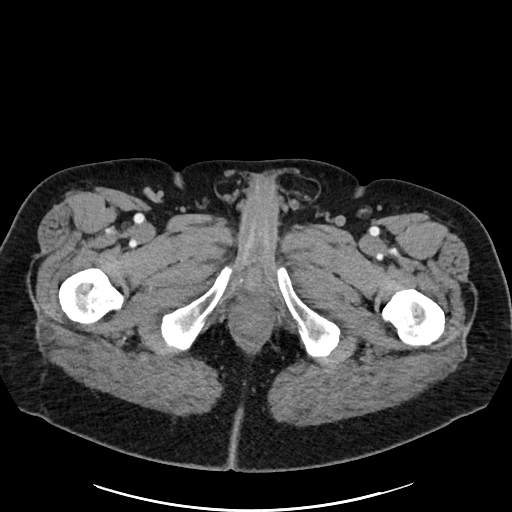
[im 12/131  bone]
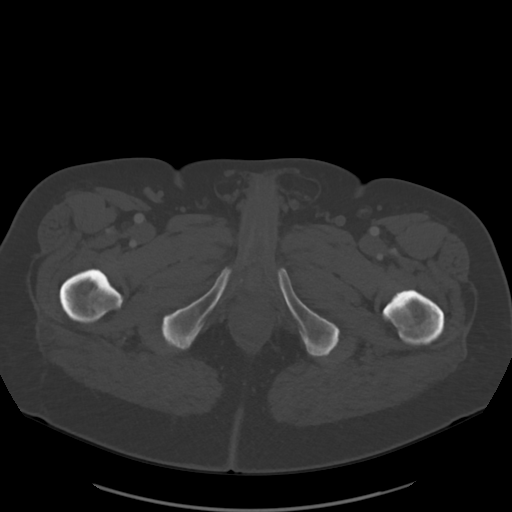
[im 24/131  mediastinal]
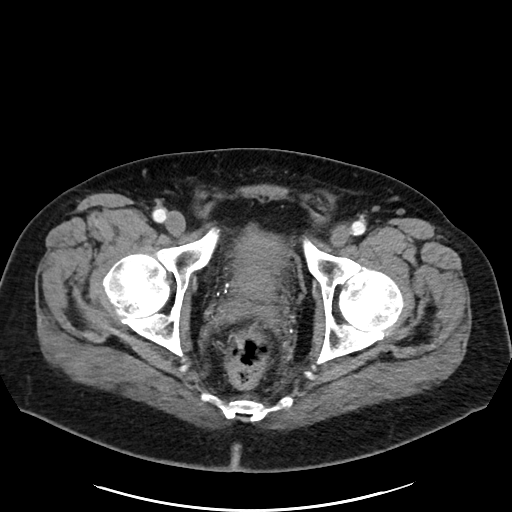
[im 36/131  mediastinal]
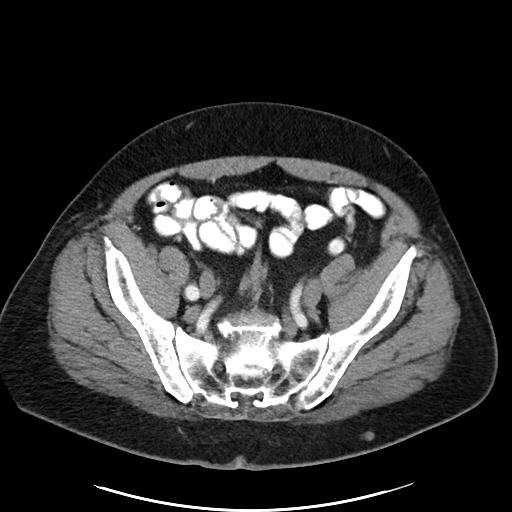
[im 48/131  mediastinal]
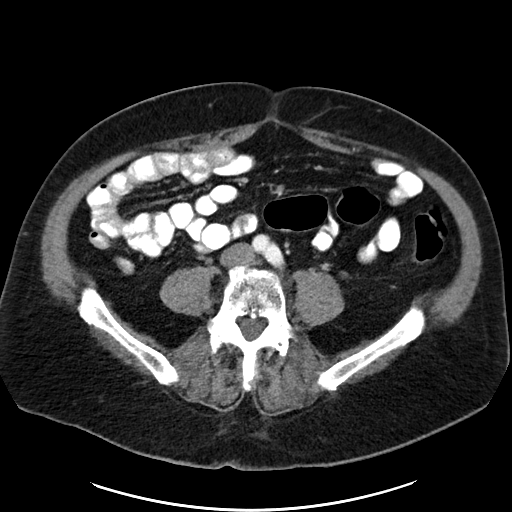
[im 60/131  mediastinal]
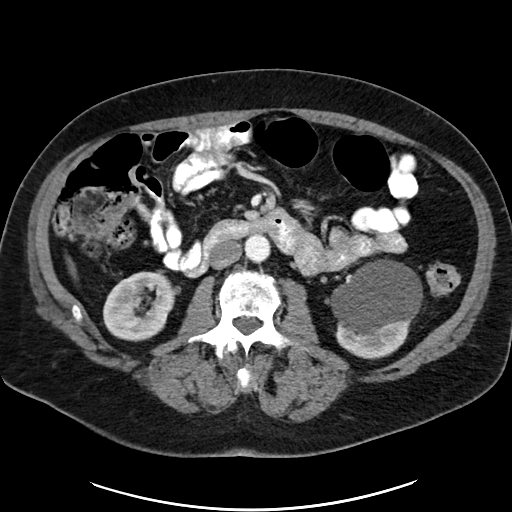
[im 71/131  mediastinal]
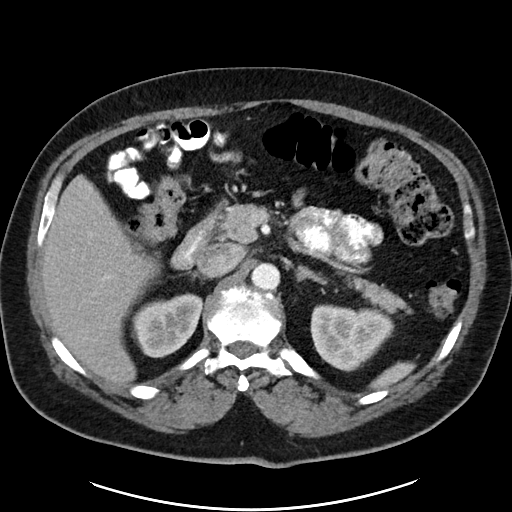
[im 83/131  mediastinal]
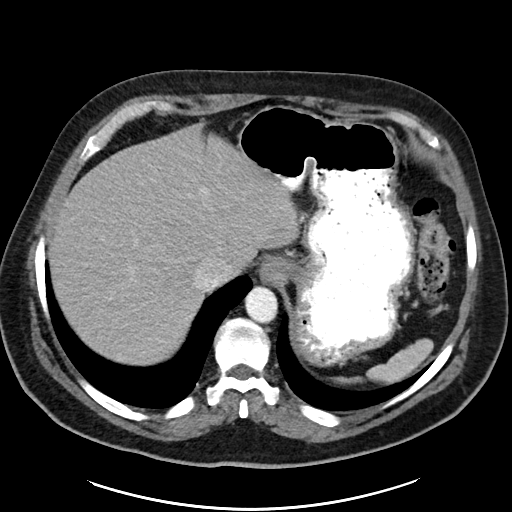
[im 95/131  mediastinal]
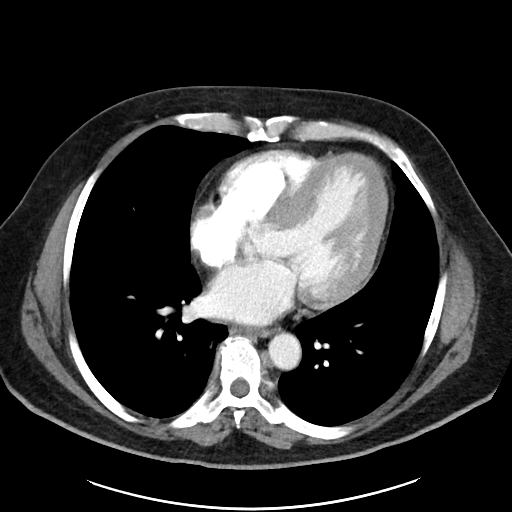
[im 107/131  mediastinal]
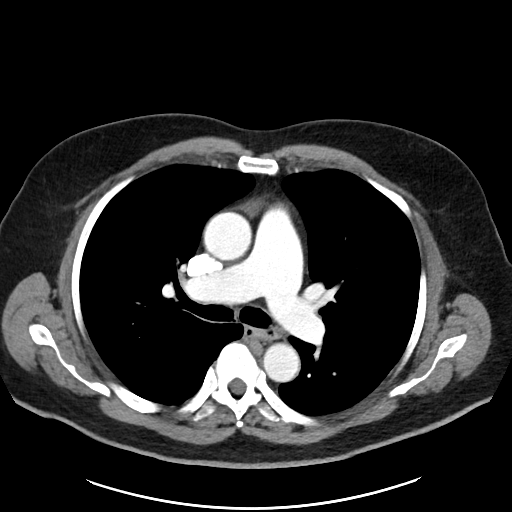
[im 107/131  bone]
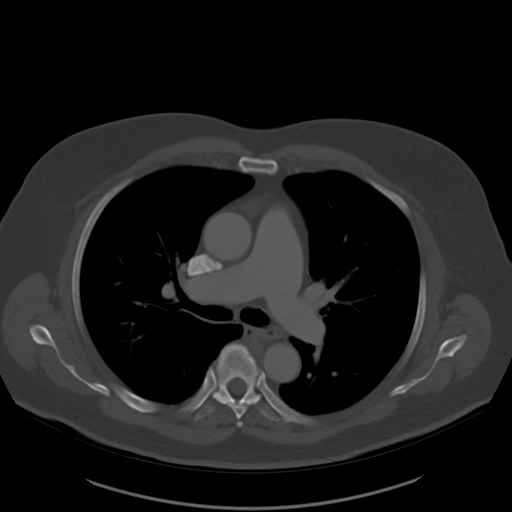
[im 119/131  mediastinal]
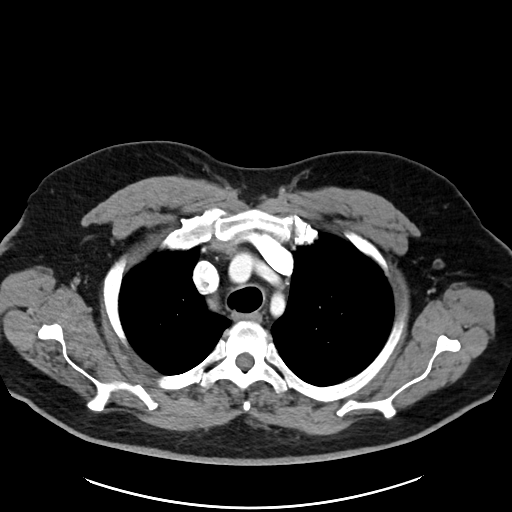

[Series 4: coronals cap 2.00 cor · coronal · 0.74mm/px · 3 of 154 slices shown]
[im 31/154  mediastinal]
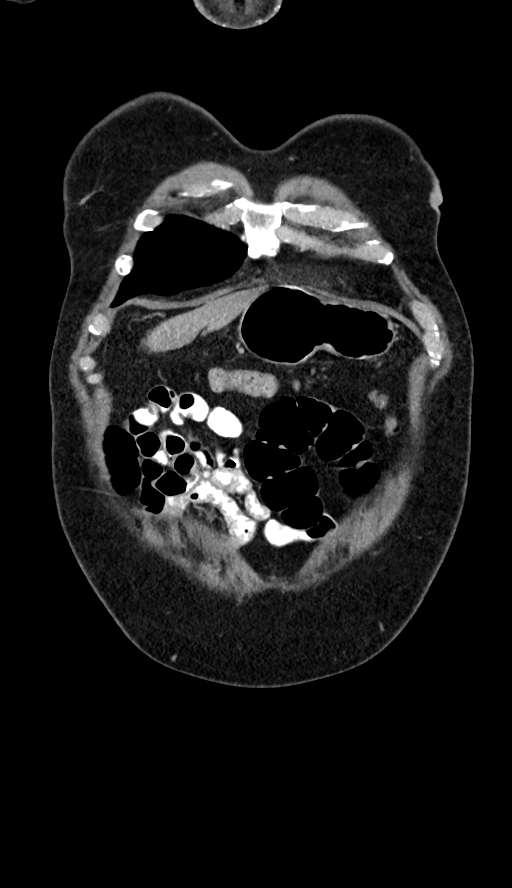
[im 62/154  mediastinal]
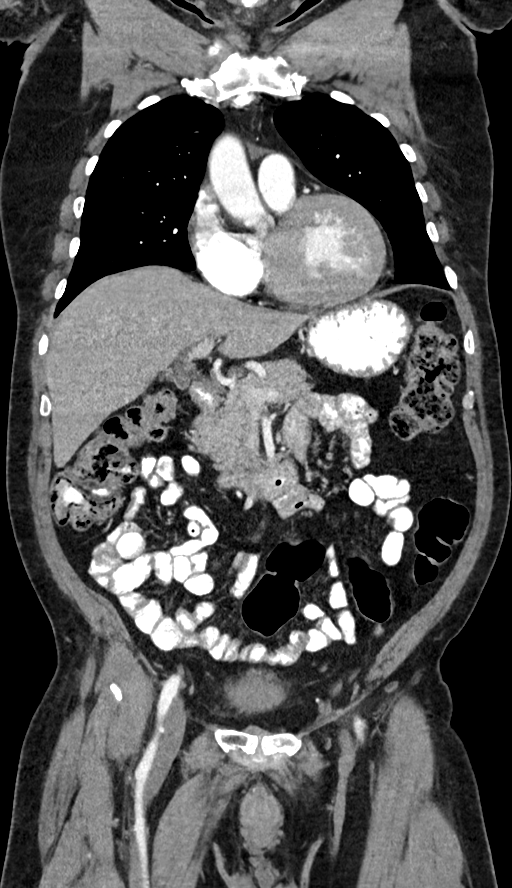
[im 92/154  mediastinal]
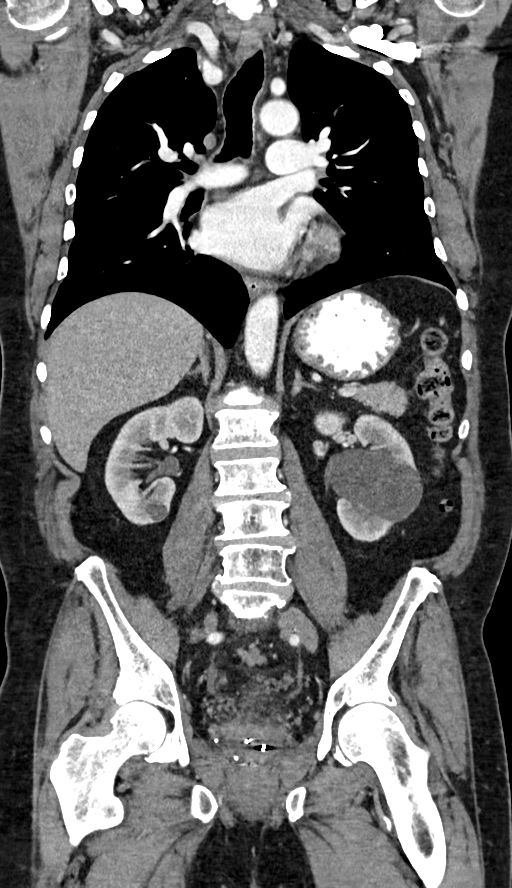

[13 of 36 positions shown; findings below may reference images not displayed]

RADIATION DOSE REDUCTION: This exam was performed according to the
departmental dose-optimization program which includes automated
exposure control, adjustment of the mA and/or kV according to
patient size and/or use of iterative reconstruction technique.

CONTRAST:  100mL OMNIPAQUE IOHEXOL 300 MG/ML  SOLN
FINDINGS: CT CHEST FINDINGS

Cardiovascular: Aortic and branch vessel atherosclerosis without
abdominal aortic aneurysm. No central pulmonary embolus on this
nondedicated study. Normal size heart. No significant pericardial
effusion/thickening. Minimal calcifications of the mitral annulus.
Scattered coronary artery calcifications.

Mediastinum/Nodes: No supraclavicular adenopathy. No suspicious
thyroid nodule. No pathologically enlarged mediastinal, hilar or
axillary lymph nodes. Reflux versus retained enteric contrast in the
esophagus.

Lungs/Pleura: Mild biapical pleuroparenchymal scarring. Solid 3 mm
right upper lobe pulmonary nodule on image 65/3. Sub solid 4 mm left
upper lobe pulmonary nodule on image [DATE]. Scarring in the
paramedian right lower lobe overlying vertebral osteophytes. No
pleural effusion. No pneumothorax.

Musculoskeletal: No chest wall mass or suspicious bone lesions
identified.

CT ABDOMEN PELVIS FINDINGS

Hepatobiliary: No suspicious hepatic lesion. Gallbladder is
unremarkable. No biliary ductal dilation.

Pancreas: No pancreatic ductal dilation or evidence of acute
inflammation.

Spleen: No splenomegaly or focal splenic lesion.

Adrenals/Urinary Tract: Bilateral adrenal glands appear normal. No
hydronephrosis. Bilateral fluid signal renal cysts measure up to
cm are considered benign and require no independent follow-up. No
suspicious renal mass. Mild wall thickening of an incompletely
distended urinary bladder.

Stomach/Bowel: Radiopaque enteric contrast material traverses the
cecum. Stomach is distended with ingested contrast material and gas
without suspicious wall thickening. No pathologic dilation of large
or small bowel. The appendix and terminal ileum appear normal.
Scattered colonic diverticulosis without findings of acute
diverticulitis. Evidence of acute bowel inflammation.

Vascular/Lymphatic: Normal caliber abdominal aorta. No
pathologically enlarged abdominal or pelvic lymph nodes.

Reproductive: Brachytherapy seeds in the prostate.

Other: No significant abdominopelvic free fluid. Small fat
containing left inguinal hernia.

Musculoskeletal: No aggressive lytic or blastic lesion of bone.
Mixed lesion in the right iliac bone along the SI joint measures 1
cm on image 110/4 this was present on MRI January 01, 2021 and did
not demonstrate abnormal radiotracer activity on nuclear medicine
bone scan March 27, 2021, consistent with a benign fibro-osseous
lesion. Chronic bilateral L5 pars defects with grade 1 L5 on S1
anterolisthesis. Multilevel degenerative changes spine. Degenerative
changes of the bilateral hips.
IMPRESSION: 1. Small bilateral pulmonary nodules measuring up to 4 mm,
nonspecific and favored to reflect an infectious or inflammatory
etiology. In the setting of known prostate malignancy consider
follow-up dedicated chest CT in 3 months to assess stability.
2. Otherwise, no convincing evidence of extraprostatic neoplastic
process or acute abnormality identified within the chest abdomen or
pelvis to explain patient's reported iron-deficiency anemia.
3. Mild wall thickening of an incompletely distended urinary
bladder, which may represent cystitis. Correlate with urinalysis.
4. Colonic diverticulosis without findings of acute diverticulitis.
5. Chronic bilateral L5 pars defects with grade 1 L5 on S1
anterolisthesis.
6.  Aortic Atherosclerosis ([id number]).
